# Patient Record
Sex: Female | Born: 1950 | Race: White | Hispanic: No | Marital: Married | Smoking: Former smoker
Health system: Southern US, Community
[De-identification: ages and names within clinical notes are randomized; demographics above are authoritative.]

## PROBLEM LIST (undated history)

## (undated) DIAGNOSIS — E559 Vitamin D deficiency, unspecified: Secondary | ICD-10-CM

## (undated) DIAGNOSIS — K219 Gastro-esophageal reflux disease without esophagitis: Secondary | ICD-10-CM

## (undated) DIAGNOSIS — D735 Infarction of spleen: Secondary | ICD-10-CM

## (undated) DIAGNOSIS — I739 Peripheral vascular disease, unspecified: Secondary | ICD-10-CM

## (undated) DIAGNOSIS — I639 Cerebral infarction, unspecified: Secondary | ICD-10-CM

## (undated) DIAGNOSIS — I82409 Acute embolism and thrombosis of unspecified deep veins of unspecified lower extremity: Secondary | ICD-10-CM

## (undated) DIAGNOSIS — I10 Essential (primary) hypertension: Secondary | ICD-10-CM

## (undated) DIAGNOSIS — E119 Type 2 diabetes mellitus without complications: Secondary | ICD-10-CM

## (undated) DIAGNOSIS — I499 Cardiac arrhythmia, unspecified: Secondary | ICD-10-CM

## (undated) DIAGNOSIS — E785 Hyperlipidemia, unspecified: Secondary | ICD-10-CM

## (undated) DIAGNOSIS — D689 Coagulation defect, unspecified: Secondary | ICD-10-CM

## (undated) DIAGNOSIS — J449 Chronic obstructive pulmonary disease, unspecified: Secondary | ICD-10-CM

## (undated) HISTORY — DX: Gastro-esophageal reflux disease without esophagitis: K21.9

## (undated) HISTORY — DX: Vitamin D deficiency, unspecified: E55.9

## (undated) HISTORY — PX: EYE SURGERY: SHX253

## (undated) HISTORY — DX: Essential (primary) hypertension: I10

## (undated) HISTORY — DX: Peripheral vascular disease, unspecified: I73.9

## (undated) HISTORY — DX: Acute embolism and thrombosis of unspecified deep veins of unspecified lower extremity: I82.409

## (undated) HISTORY — PX: ELBOW SURGERY: SHX618

## (undated) HISTORY — DX: Hyperlipidemia, unspecified: E78.5

## (undated) HISTORY — DX: Type 2 diabetes mellitus without complications: E11.9

## (undated) HISTORY — DX: Cerebral infarction, unspecified: I63.9

## (undated) HISTORY — PX: WISDOM TOOTH EXTRACTION: SHX21

## (undated) HISTORY — DX: Chronic obstructive pulmonary disease, unspecified: J44.9

---

## 2009-08-03 ENCOUNTER — Ambulatory Visit (HOSPITAL_COMMUNITY): Admission: RE | Admit: 2009-08-03 | Discharge: 2009-08-03 | Payer: Self-pay | Admitting: Family Medicine

## 2009-08-03 ENCOUNTER — Ambulatory Visit: Payer: Self-pay | Admitting: Vascular Surgery

## 2011-01-15 ENCOUNTER — Ambulatory Visit (HOSPITAL_COMMUNITY): Payer: Self-pay

## 2011-01-16 ENCOUNTER — Ambulatory Visit (HOSPITAL_COMMUNITY)
Admission: RE | Admit: 2011-01-16 | Discharge: 2011-01-16 | Disposition: A | Discharge status: Home / Self Care | Payer: Managed Care, Other (non HMO) | Source: Ambulatory Visit | Attending: Family Medicine | Admitting: Family Medicine

## 2011-01-16 DIAGNOSIS — M7989 Other specified soft tissue disorders: Secondary | ICD-10-CM | POA: Insufficient documentation

## 2011-01-16 DIAGNOSIS — M79609 Pain in unspecified limb: Secondary | ICD-10-CM | POA: Insufficient documentation

## 2011-01-16 DIAGNOSIS — Z86718 Personal history of other venous thrombosis and embolism: Secondary | ICD-10-CM | POA: Insufficient documentation

## 2012-07-23 ENCOUNTER — Ambulatory Visit: Payer: Managed Care, Other (non HMO) | Attending: Orthopedic Surgery | Admitting: Physical Therapy

## 2012-07-23 DIAGNOSIS — M25669 Stiffness of unspecified knee, not elsewhere classified: Secondary | ICD-10-CM | POA: Insufficient documentation

## 2012-07-23 DIAGNOSIS — M25569 Pain in unspecified knee: Secondary | ICD-10-CM | POA: Insufficient documentation

## 2012-07-23 DIAGNOSIS — IMO0001 Reserved for inherently not codable concepts without codable children: Secondary | ICD-10-CM | POA: Insufficient documentation

## 2012-07-23 DIAGNOSIS — R5381 Other malaise: Secondary | ICD-10-CM | POA: Insufficient documentation

## 2012-07-28 ENCOUNTER — Ambulatory Visit: Payer: Managed Care, Other (non HMO) | Admitting: Physical Therapy

## 2012-07-30 ENCOUNTER — Ambulatory Visit: Payer: Managed Care, Other (non HMO) | Admitting: Physical Therapy

## 2012-08-04 ENCOUNTER — Encounter: Payer: Managed Care, Other (non HMO) | Admitting: Physical Therapy

## 2012-08-07 ENCOUNTER — Ambulatory Visit: Payer: Managed Care, Other (non HMO) | Admitting: Physical Therapy

## 2012-08-11 ENCOUNTER — Ambulatory Visit: Payer: Managed Care, Other (non HMO) | Admitting: Physical Therapy

## 2012-08-13 ENCOUNTER — Ambulatory Visit: Payer: Managed Care, Other (non HMO) | Admitting: Physical Therapy

## 2012-08-18 ENCOUNTER — Ambulatory Visit: Payer: Managed Care, Other (non HMO) | Attending: Orthopedic Surgery | Admitting: Physical Therapy

## 2012-08-18 DIAGNOSIS — IMO0001 Reserved for inherently not codable concepts without codable children: Secondary | ICD-10-CM | POA: Insufficient documentation

## 2012-08-18 DIAGNOSIS — M25569 Pain in unspecified knee: Secondary | ICD-10-CM | POA: Insufficient documentation

## 2012-08-18 DIAGNOSIS — R5381 Other malaise: Secondary | ICD-10-CM | POA: Insufficient documentation

## 2012-08-18 DIAGNOSIS — M25669 Stiffness of unspecified knee, not elsewhere classified: Secondary | ICD-10-CM | POA: Insufficient documentation

## 2012-08-20 ENCOUNTER — Ambulatory Visit: Payer: Managed Care, Other (non HMO) | Admitting: Physical Therapy

## 2015-08-17 ENCOUNTER — Other Ambulatory Visit: Payer: Self-pay | Admitting: Family Medicine

## 2015-08-17 DIAGNOSIS — R1032 Left lower quadrant pain: Secondary | ICD-10-CM

## 2015-08-24 ENCOUNTER — Ambulatory Visit
Admission: RE | Admit: 2015-08-24 | Discharge: 2015-08-24 | Disposition: A | Discharge status: Home / Self Care | Payer: Medicare Other | Source: Ambulatory Visit | Attending: Family Medicine | Admitting: Family Medicine

## 2015-08-24 DIAGNOSIS — R1032 Left lower quadrant pain: Secondary | ICD-10-CM

## 2015-09-04 ENCOUNTER — Encounter: Payer: Self-pay | Admitting: Vascular Surgery

## 2015-09-04 ENCOUNTER — Other Ambulatory Visit: Payer: Self-pay | Admitting: *Deleted

## 2015-09-04 DIAGNOSIS — I739 Peripheral vascular disease, unspecified: Secondary | ICD-10-CM

## 2015-10-26 ENCOUNTER — Other Ambulatory Visit: Payer: Self-pay | Admitting: *Deleted

## 2015-10-26 DIAGNOSIS — I739 Peripheral vascular disease, unspecified: Secondary | ICD-10-CM

## 2015-10-30 ENCOUNTER — Encounter: Payer: Self-pay | Admitting: Vascular Surgery

## 2015-11-03 ENCOUNTER — Encounter: Payer: Self-pay | Admitting: Vascular Surgery

## 2015-11-03 ENCOUNTER — Ambulatory Visit (INDEPENDENT_AMBULATORY_CARE_PROVIDER_SITE_OTHER)
Admission: RE | Admit: 2015-11-03 | Discharge: 2015-11-03 | Disposition: A | Discharge status: Home / Self Care | Payer: Managed Care, Other (non HMO) | Source: Ambulatory Visit | Attending: Vascular Surgery | Admitting: Vascular Surgery

## 2015-11-03 ENCOUNTER — Ambulatory Visit (INDEPENDENT_AMBULATORY_CARE_PROVIDER_SITE_OTHER): Payer: Managed Care, Other (non HMO) | Admitting: Vascular Surgery

## 2015-11-03 ENCOUNTER — Ambulatory Visit (HOSPITAL_COMMUNITY)
Admission: RE | Admit: 2015-11-03 | Discharge: 2015-11-03 | Disposition: A | Discharge status: Home / Self Care | Payer: Managed Care, Other (non HMO) | Source: Ambulatory Visit | Attending: Vascular Surgery | Admitting: Vascular Surgery

## 2015-11-03 VITALS — BP 127/88 | HR 81 | Ht 63.0 in | Wt 161.8 lb

## 2015-11-03 DIAGNOSIS — I82532 Chronic embolism and thrombosis of left popliteal vein: Secondary | ICD-10-CM | POA: Diagnosis not present

## 2015-11-03 DIAGNOSIS — I87009 Postthrombotic syndrome without complications of unspecified extremity: Secondary | ICD-10-CM | POA: Insufficient documentation

## 2015-11-03 DIAGNOSIS — I739 Peripheral vascular disease, unspecified: Secondary | ICD-10-CM | POA: Diagnosis present

## 2015-11-03 DIAGNOSIS — E785 Hyperlipidemia, unspecified: Secondary | ICD-10-CM | POA: Insufficient documentation

## 2015-11-03 DIAGNOSIS — I872 Venous insufficiency (chronic) (peripheral): Secondary | ICD-10-CM | POA: Insufficient documentation

## 2015-11-03 DIAGNOSIS — I10 Essential (primary) hypertension: Secondary | ICD-10-CM | POA: Diagnosis not present

## 2015-11-03 DIAGNOSIS — Z87891 Personal history of nicotine dependence: Secondary | ICD-10-CM | POA: Diagnosis not present

## 2015-11-03 NOTE — Progress Notes (Signed)
Referred by: Wonda CeriseKirsten Kaplan, [facility]  Reason for referral: Swollen left leg  History of Present Illness  Sydney GarinDiana Herring is a 65 y.o. female who presents with chief complaint: swollen leg.  Patient notes, onset of swelling 10 years ago, associated with left DVT in the popliteal vein.  She was on anticoagulation for 5 years.  The patient has had no history of varicose veins, no history of venous stasis ulcers, no history of  Lymphedema and no history of skin changes in lower legs.  There is no family history of venous disorders.  The patient has  used compression stockings in the past OTC.  Other past medical history includes: DM managed with Metformin, HTN managed with Norvasc, COPD managed with albuterol and smoking cessation, and hyperlipidemia managed with Lipitor.    Past Medical History  Diagnosis Date  . Diabetes mellitus without complication (HCC)   . Peripheral vascular disease (HCC)   . Hyperlipidemia   . Hypertension   . Gastroesophageal reflux   . DVT (deep venous thrombosis) (HCC)   . Vitamin D deficiency   . COPD (chronic obstructive pulmonary disease) [facility](HCC)     Past Surgical History  Procedure Laterality Date  . Elbow surgery    . Wisdom tooth extraction      Social History   Social History  . Marital Status: Married    Spouse Name: N/A  . Number of Children: N/A  . Years of Education: N/A   Occupational History  . Not on file.   Social History Main Topics  . Smoking status: Former Smoker    Quit date: 08/19/2015  . Smokeless tobacco: Not on file  . Alcohol Use: 0.0 oz/week    0 Standard drinks or equivalent per week     Comment: social  . Drug Use: No  . Sexual Activity: Not on file   Other Topics Concern  . Not on file   Social History Narrative   Family History: patient is unable to detail the medical history of his parents   Current Outpatient Prescriptions on File Prior to Visit  Medication Sig Dispense Refill  . albuterol (PROVENTIL HFA)  108 (90 Base) MCG/ACT inhaler Inhale 2 puffs into the lungs every 4 (four) hours as needed for wheezing or shortness of breath.    [name]. amLODipine (NORVASC) 2.5 MG tablet Take 2.5 mg by mouth daily.    [name]. atorvastatin (LIPITOR) 10 MG tablet Take 10 mg by mouth daily.    . Fluticasone-Salmeterol (ADVAIR) 250-50 MCG/DOSE AEPB Inhale 1 puff into the lungs 2 (two) times daily.    . metFORMIN (GLUCOPHAGE-XR) 750 MG 24 hr tablet Take 750 mg by mouth daily with breakfast.    . varenicline (CHANTIX) 1 MG tablet Take 1 mg by mouth 2 (two) times daily.     No current facility-administered medications on file prior to visit.    Allergies as of 11/03/2015  . (No Known Allergies)   ROS:   General:  No weight loss, Fever, chills  HEENT: No recent headaches, no nasal bleeding, no visual changes, no sore throat  Neurologic: No dizziness, blackouts, seizures. No recent symptoms of stroke or mini- stroke. No recent episodes of slurred speech, or temporary blindness.  Cardiac: No recent episodes of chest pain/pressure, no shortness of breath at rest.  No shortness of breath with exertion.  Denies history of atrial fibrillation or irregular heartbeat  Vascular: No history of rest pain in feet.  No history of claudication.  No history  of non-healing ulcer, Positve history of DVT   Pulmonary: No home oxygen, no productive cough, no hemoptysis,  No asthma or wheezing  Musculoskeletal:   Arthritis, [x ] Low back pain,   Joint pain  Hematologic:No history of hypercoagulable state.  No history of easy bleeding.  No history of anemia  Gastrointestinal: No hematochezia or melena,  No gastroesophageal reflux, no trouble swallowing  Urinary:  chronic Kidney disease,  on HD -  MWF or  TTHS,  Burning with urination,  Frequent urination,  Difficulty urinating;   Skin: No rashes  Psychological: No history of anxiety,  No history of depression   Physical Examination  Filed Vitals:    11/03/15 1422  BP: 127/88  Pulse: 81  Height:  (1.6 m)  Weight: 161 lb 12.8 oz (73.392 kg)  SpO2: 100%    Body mass index is 28.67 kg/(m^2).  General:  Alert and oriented, no acute distress HEENT: Normal Neck: No bruit or JVD Pulmonary: Clear to auscultation bilaterally Cardiac: Regular Rate and Rhythm without murmur Abdomen: Soft, non-tender, non-distended, no mass, no scars Skin: No rash Extremity Pulses:  2+ radial, brachial, femoral, dorsalis pedis, posterior tibial pulses bilaterally Musculoskeletal: Positive left LE from the knee down edema  Neurologic: Upper and lower extremity motor 5/5 and symmetric Psychiatric: Judgment intact, Mood & affect appropriate for pt's clinical situation Lymph : No Cervical, Axillary, or Inguinal lymphadenopathy   DATA: Venous reflux study:  Residual left popliteal thrombus  Positive reflux in the common femoral, femoral and popliteal vein  No GSV and SSV reflux  ABI's normal  Right 1.1  Left 1.0   Assessment: Post thrombotic syndrome S/P Left popliteal DVT managed for 5 years with oral coumadin  Plan: She is encouraged to wear compression thigh high stocking and or compression panty hose. Water aerobic can help also due to hydrostatic compression as well as elevation when at rest.   The is no definitive cure, the vein has been damaged by the DVT and causes reflux.  The treatment plan above will help in the long run to prevent skin breakdown from chronic edema.    F/U PRN  Thomasena Edis, Siani Utke MAUREEN PA-C [facility]  The patient was seen in conjunction with Dr. Imogene Burn  Addendum  I have independently interviewed and examined the patient, and I agree with the physician assistant's findings.  Pt essential presents with mild post-phlebitic syndrome.  There is no LDS despite persistent LLE edema 1+.  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  Leonides Sake, MD [facility] [place] Office: [phone] Pager: [phone]  11/03/2015, 3:16 PM

## 2016-04-19 DIAGNOSIS — D735 Infarction of spleen: Secondary | ICD-10-CM

## 2016-04-19 HISTORY — DX: Infarction of spleen: D73.5

## 2016-05-06 ENCOUNTER — Encounter (HOSPITAL_BASED_OUTPATIENT_CLINIC_OR_DEPARTMENT_OTHER): Payer: Self-pay | Admitting: *Deleted

## 2016-05-06 ENCOUNTER — Emergency Department (HOSPITAL_BASED_OUTPATIENT_CLINIC_OR_DEPARTMENT_OTHER): Payer: Managed Care, Other (non HMO)

## 2016-05-06 ENCOUNTER — Inpatient Hospital Stay (HOSPITAL_BASED_OUTPATIENT_CLINIC_OR_DEPARTMENT_OTHER)
Admission: EM | Admit: 2016-05-06 | Discharge: 2016-05-09 | DRG: 816 | Disposition: A | Discharge status: Home / Self Care | Payer: Managed Care, Other (non HMO) | Attending: Family Medicine | Admitting: Family Medicine

## 2016-05-06 DIAGNOSIS — J449 Chronic obstructive pulmonary disease, unspecified: Secondary | ICD-10-CM | POA: Diagnosis present

## 2016-05-06 DIAGNOSIS — F17201 Nicotine dependence, unspecified, in remission: Secondary | ICD-10-CM

## 2016-05-06 DIAGNOSIS — Z86718 Personal history of other venous thrombosis and embolism: Secondary | ICD-10-CM

## 2016-05-06 DIAGNOSIS — E118 Type 2 diabetes mellitus with unspecified complications: Secondary | ICD-10-CM

## 2016-05-06 DIAGNOSIS — Z6829 Body mass index (BMI) 29.0-29.9, adult: Secondary | ICD-10-CM

## 2016-05-06 DIAGNOSIS — I749 Embolism and thrombosis of unspecified artery: Secondary | ICD-10-CM

## 2016-05-06 DIAGNOSIS — Z7984 Long term (current) use of oral hypoglycemic drugs: Secondary | ICD-10-CM

## 2016-05-06 DIAGNOSIS — D735 Infarction of spleen: Principal | ICD-10-CM | POA: Diagnosis present

## 2016-05-06 DIAGNOSIS — I10 Essential (primary) hypertension: Secondary | ICD-10-CM | POA: Diagnosis present

## 2016-05-06 DIAGNOSIS — T508X5A Adverse effect of diagnostic agents, initial encounter: Secondary | ICD-10-CM

## 2016-05-06 DIAGNOSIS — I700 Atherosclerosis of aorta: Secondary | ICD-10-CM | POA: Diagnosis present

## 2016-05-06 DIAGNOSIS — E785 Hyperlipidemia, unspecified: Secondary | ICD-10-CM | POA: Diagnosis present

## 2016-05-06 DIAGNOSIS — Z7951 Long term (current) use of inhaled steroids: Secondary | ICD-10-CM

## 2016-05-06 DIAGNOSIS — I82532 Chronic embolism and thrombosis of left popliteal vein: Secondary | ICD-10-CM | POA: Diagnosis present

## 2016-05-06 DIAGNOSIS — E1169 Type 2 diabetes mellitus with other specified complication: Secondary | ICD-10-CM | POA: Diagnosis present

## 2016-05-06 DIAGNOSIS — Z87891 Personal history of nicotine dependence: Secondary | ICD-10-CM

## 2016-05-06 DIAGNOSIS — Z833 Family history of diabetes mellitus: Secondary | ICD-10-CM

## 2016-05-06 DIAGNOSIS — E669 Obesity, unspecified: Secondary | ICD-10-CM | POA: Diagnosis present

## 2016-05-06 DIAGNOSIS — E1151 Type 2 diabetes mellitus with diabetic peripheral angiopathy without gangrene: Secondary | ICD-10-CM | POA: Diagnosis present

## 2016-05-06 DIAGNOSIS — K219 Gastro-esophageal reflux disease without esophagitis: Secondary | ICD-10-CM | POA: Diagnosis present

## 2016-05-06 DIAGNOSIS — E559 Vitamin D deficiency, unspecified: Secondary | ICD-10-CM | POA: Diagnosis present

## 2016-05-06 DIAGNOSIS — R1012 Left upper quadrant pain: Secondary | ICD-10-CM | POA: Diagnosis not present

## 2016-05-06 HISTORY — DX: Infarction of spleen: D73.5

## 2016-05-06 LAB — LIPASE, BLOOD: LIPASE: 21 U/L (ref 11–51)

## 2016-05-06 LAB — URINALYSIS, MICROSCOPIC (REFLEX)

## 2016-05-06 LAB — COMPREHENSIVE METABOLIC PANEL
ALK PHOS: 81 U/L (ref 38–126)
ALT: 32 U/L (ref 14–54)
AST: 24 U/L (ref 15–41)
Albumin: 4.7 g/dL (ref 3.5–5.0)
Anion gap: 12 (ref 5–15)
BUN: 21 mg/dL — AB (ref 6–20)
CALCIUM: 9.1 mg/dL (ref 8.9–10.3)
CHLORIDE: 102 mmol/L (ref 101–111)
CO2: 24 mmol/L (ref 22–32)
CREATININE: 0.78 mg/dL (ref 0.44–1.00)
Glucose, Bld: 182 mg/dL — ABNORMAL HIGH (ref 65–99)
Potassium: 3.9 mmol/L (ref 3.5–5.1)
Sodium: 138 mmol/L (ref 135–145)
Total Bilirubin: 1 mg/dL (ref 0.3–1.2)
Total Protein: 7.9 g/dL (ref 6.5–8.1)

## 2016-05-06 LAB — URINALYSIS, ROUTINE W REFLEX MICROSCOPIC
BILIRUBIN URINE: NEGATIVE
Glucose, UA: 250 mg/dL — AB
KETONES UR: NEGATIVE mg/dL
Leukocytes, UA: NEGATIVE
NITRITE: NEGATIVE
Protein, ur: 30 mg/dL — AB
SPECIFIC GRAVITY, URINE: 1.036 — AB (ref 1.005–1.030)
pH: 6 (ref 5.0–8.0)

## 2016-05-06 LAB — CBC WITH DIFFERENTIAL/PLATELET
BASOS ABS: 0 10*3/uL (ref 0.0–0.1)
Basophils Relative: 0 %
EOS PCT: 0 %
Eosinophils Absolute: 0 10*3/uL (ref 0.0–0.7)
HEMATOCRIT: 39.3 % (ref 36.0–46.0)
HEMOGLOBIN: 13.1 g/dL (ref 12.0–15.0)
LYMPHS ABS: 1.3 10*3/uL (ref 0.7–4.0)
LYMPHS PCT: 15 %
MCH: 29.9 pg (ref 26.0–34.0)
MCHC: 33.3 g/dL (ref 30.0–36.0)
MCV: 89.7 fL (ref 78.0–100.0)
Monocytes Absolute: 0.3 10*3/uL (ref 0.1–1.0)
Monocytes Relative: 4 %
NEUTROS ABS: 7 10*3/uL (ref 1.7–7.7)
NEUTROS PCT: 81 %
PLATELETS: 304 10*3/uL (ref 150–400)
RBC: 4.38 MIL/uL (ref 3.87–5.11)
RDW: 13.9 % (ref 11.5–15.5)
WBC: 8.7 10*3/uL (ref 4.0–10.5)

## 2016-05-06 MED ORDER — ONDANSETRON HCL 4 MG/2ML IJ SOLN
4.0000 mg | Freq: Once | INTRAMUSCULAR | Status: AC
  Administered 2016-05-06: 4 mg via INTRAVENOUS
  Filled 2016-05-06: qty 2

## 2016-05-06 MED ORDER — KETOROLAC TROMETHAMINE 30 MG/ML IJ SOLN
30.0000 mg | Freq: Once | INTRAMUSCULAR | Status: AC
  Administered 2016-05-06: 30 mg via INTRAVENOUS
  Filled 2016-05-06: qty 1

## 2016-05-06 MED ORDER — MORPHINE SULFATE (PF) 4 MG/ML IV SOLN
4.0000 mg | Freq: Once | INTRAVENOUS | Status: AC
  Administered 2016-05-06: 4 mg via INTRAVENOUS
  Filled 2016-05-06: qty 1

## 2016-05-06 MED ORDER — IOPAMIDOL (ISOVUE-300) INJECTION 61%
100.0000 mL | Freq: Once | INTRAVENOUS | Status: AC | PRN
  Administered 2016-05-07: 100 mL via INTRAVENOUS

## 2016-05-06 MED ORDER — SODIUM CHLORIDE 0.9 % IV BOLUS (SEPSIS)
1000.0000 mL | Freq: Once | INTRAVENOUS | Status: AC
  Administered 2016-05-06: 1000 mL via INTRAVENOUS

## 2016-05-06 NOTE — ED Notes (Signed)
C/o n/v and abd pain x 1 day  Denies diarrhea,  Denies ua sx

## 2016-05-06 NOTE — ED Triage Notes (Signed)
Abdominal pain. Vomited.

## 2016-05-06 NOTE — ED Provider Notes (Addendum)
[facility] Note   CSN: [id number] Arrival date & time: 05/06/16  1943  By signing my name below, I, Sydney Herring, attest that this documentation has been prepared under the direction and in the presence of Tomasita CrumbleAdeleke Briah Nary, MD. Electronically signed, Sydney Herring, ED Scribe. 05/06/16. 11:22 PM.  History   Chief Complaint Chief Complaint  Patient presents with  . Abdominal Pain    HPI HPI Comments: [name] is a 65 y.o. female, with Hx of COPD, DVT, DM who presents to the Emergency Department complaining of gradual onset, constant abdominal pain that started earlier today. Pt reports pain radiating from the center of her abdomen around to her back. She reports one episode of nausea and vomiting in the waiting room of the ED. Pt reports Hx of intermittent similar symptoms for the past 8 months, today's episode was so severe that she was unable to lay down. She takes an OTC antacid. She reports a feeling of pressure "under her chest" that radiates behind to her back. No diarrhea. No shortness of breath.  The history is provided by the patient. No language interpreter was used.    Past Medical History:  Diagnosis Date  . COPD (chronic obstructive pulmonary disease) (HCC)   . Diabetes mellitus without complication (HCC)   . DVT (deep venous thrombosis) (HCC)   . Gastroesophageal reflux   . Hyperlipidemia   . Hypertension   . Peripheral vascular disease (HCC)   . Vitamin D deficiency     Patient Active Problem List   Diagnosis Date Noted  . 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 Chronic venous insufficiency 11/03/2015    Past Surgical History:  Procedure Laterality Date  . ELBOW SURGERY    . EYE SURGERY    . WISDOM TOOTH EXTRACTION      OB History    No data available      Home Medications    Prior to Admission medications    Medication Sig Start Date End Date Taking? Authorizing Provider  albuterol (PROVENTIL HFA) 108 (90 Base) MCG/ACT inhaler Inhale 2 puffs into the lungs every 4 (four) hours as needed for wheezing or shortness of breath.   Yes Historical Provider, MD  amLODipine (NORVASC) 2.5 MG tablet Take 2.5 mg by mouth daily.   Yes Historical Provider, MD  atorvastatin (LIPITOR) 10 MG tablet Take 10 mg by mouth daily.   Yes Historical Provider, MD  Fluticasone-Salmeterol (ADVAIR) 250-50 MCG/DOSE AEPB Inhale 1 puff into the lungs 2 (two) times daily.   Yes Historical Provider, MD  metFORMIN (GLUCOPHAGE-XR) 750 MG 24 hr tablet Take 750 mg by mouth daily with breakfast.   Yes Historical Provider, MD  varenicline (CHANTIX) 1 MG tablet Take 1 mg by mouth 2 (two) times daily.    Historical Provider, MD    Family History No family history on file.  Social History Social History  Substance Use Topics  . Smoking status: Former Smoker    Quit date: 08/19/2015  . Smokeless tobacco: Never Used  . Alcohol use 0.0 oz/week     Comment: social    Allergies   Patient has no known allergies.   Review of Systems Review of Systems A complete 10 system review of systems was obtained and all systems are negative except as noted in the HPI and PMH.    Physical Exam Updated Vital Signs BP 136/89  Pulse 81   Temp 98.1 F (36.7 C)   Resp 16   Ht 5\' 3"  (1.6 m)   Wt 158 lb (71.7 kg)   SpO2 97%   BMI 27.99 kg/m   Physical Exam  Constitutional: She is oriented to person, place, and time. She appears well-developed and well-nourished. No distress.  HENT:  Head: Normocephalic and atraumatic.  Nose: Nose normal.  Mouth/Throat: Oropharynx is clear and moist. No oropharyngeal exudate.  Eyes: Conjunctivae and EOM are normal. Pupils are equal, round, and reactive to light. No scleral icterus.  Neck: Normal range of motion. Neck supple. No JVD present. No tracheal deviation present. No thyromegaly present.   Cardiovascular: Normal rate, regular rhythm and normal heart sounds.  Exam reveals no gallop and no friction rub.   No murmur heard. Pulmonary/Chest: Effort normal and breath sounds normal. No respiratory distress. She has no wheezes. She exhibits no tenderness.  Abdominal: Soft. Bowel sounds are normal. She exhibits no distension and no mass. There is tenderness. There is no rebound and no guarding.  Diffuse abdominal tenderness to palpation, worse in RUQ.  Musculoskeletal: Normal range of motion. She exhibits no edema or tenderness.  Lymphadenopathy:    She has no cervical adenopathy.  Neurological: She is alert and oriented to person, place, and time. No cranial nerve deficit. She exhibits normal muscle tone.  Skin: Skin is warm and dry. No rash noted. No erythema. No pallor.  Nursing note and vitals reviewed.   ED Treatments / Results  DIAGNOSTIC STUDIES: Oxygen Saturation is 100% on RA, normal by my interpretation.  COORDINATION OF CARE: 11:10 PM-Discussed treatment plan with pt at bedside and pt agreed to plan.   Labs (all labs ordered are listed, but only abnormal results are displayed) Labs Reviewed  URINALYSIS, ROUTINE W REFLEX MICROSCOPIC - Abnormal; Notable for the following:       Result Value   Specific Gravity, Urine 1.036 (*)    Glucose, UA 250 (*)    Hgb urine dipstick TRACE (*)    Protein, ur 30 (*)    All other components within normal limits  URINALYSIS, MICROSCOPIC (REFLEX) - Abnormal; Notable for the following:    Bacteria, UA RARE (*)    Squamous Epithelial / LPF 0-5 (*)    All other components within normal limits  COMPREHENSIVE METABOLIC PANEL - Abnormal; Notable for the following:    Glucose, Bld 182 (*)    BUN 21 (*)    All other components within normal limits  CULTURE, BLOOD (ROUTINE X 2)  CULTURE, BLOOD (ROUTINE X 2)  CBC WITH DIFFERENTIAL/PLATELET  LIPASE, BLOOD    EKG  EKG Interpretation  Date/Time:  Monday May 06 2016 23:24:24  EST Ventricular Rate:  86 PR Interval:    QRS Duration: 105 QT Interval:  418 QTC Calculation: 500 R Axis:   51 Text Interpretation:  Sinus rhythm Multiple ventricular premature complexes RSR' in V1 or V2, right VCD or RVH Borderline T abnormalities, anterior leads Borderline prolonged QT interval No old tracing to compare Confirmed by Erroll Luna [phone]) on 05/07/2016 12:01:35 AM       Radiology Ct Abdomen Pelvis W Contrast  Result Date: 05/07/2016 CLINICAL DATA:  65 year old female with diffuse abdominal pain worse in the left upper quadrant. EXAM: CT ABDOMEN AND PELVIS WITH CONTRAST TECHNIQUE: Multidetector CT imaging of the abdomen and pelvis was performed using the standard protocol following bolus administration of intravenous contrast. CONTRAST:  ISOVUE-300 IOPAMIDOL (ISOVUE-300) INJECTION 61% COMPARISON:  Abdominal ultrasound dated 08/24/2015 FINDINGS: Lower chest: The visualized lung bases are clear. Coronary vascular calcification involving the LAD. No intra-abdominal free air or free fluid. Hepatobiliary: Mild diffuse fatty infiltration of the liver. Of the liver is otherwise unremarkable. No intrahepatic biliary ductal dilatation. There is irregular and thickened appearance of the gallbladder fundus likely adenomyomatosis. No gallstones. Pancreas: Unremarkable. No pancreatic ductal dilatation or surrounding inflammatory changes. Spleen: There is a 3.0 x 3.3 cm area of non enhancement involving the inferior pole of the spleen compatible with a focal splenic infarct. No perisplenic fluid collection, abscess, or hematoma. Adrenals/Urinary Tract: The adrenal glands appear unremarkable. The kidneys, visualized ureters, and urinary bladder are grossly unremarkable. Stomach/Bowel: There is a small hiatal hernia with gastroesophageal reflux. Oral contrast opacifies the stomach and multiple loops of small bowel traverses into the colon. No evidence of bowel obstruction or active  inflammation. There is sigmoid diverticulosis with muscular hypertrophy. No definite active inflammatory changes identified. Normal appendix. Vascular/Lymphatic: There is moderate aortoiliac atherosclerotic disease. The abdominal aorta and IVC are otherwise unremarkable. No portal venous gas identified. The splenic vein, SMV, and main portal vein are patent. There is no adenopathy. Reproductive: The uterus and ovaries are grossly unremarkable. Other: None Musculoskeletal: No acute or significant osseous findings. 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 No fluid collection/ abscess or hematoma. Fatty liver. Sigmoid diverticulosis. No evidence of bowel obstruction or active inflammation. Normal appendix. Electronically Signed   By: Elgie Collard M.D.   On: 05/07/2016 02:16    Procedures Procedures (including critical care time)  Medications Ordered in ED Medications  sodium chloride 0.9 % bolus 1,000 mL (0 mLs Intravenous Stopped 05/06/16 2334)  ondansetron (ZOFRAN) injection 4 mg (4 mg Intravenous Given 05/06/16 2247)  morphine 4 MG/ML injection 4 mg (4 mg Intravenous Given 05/06/16 2332)  ketorolac (TORADOL) 30 MG/ML injection 30 mg (30 mg Intravenous Given 05/06/16 2333)  iopamidol (ISOVUE-300) 61 % injection 100 mL (100 mLs Intravenous Contrast Given 05/07/16 0144)  morphine 4 MG/ML injection 4 mg (4 mg Intravenous Given 05/07/16 0240)     Initial Impression / Assessment and Plan / ED Course  I have reviewed the triage vital signs and the nursing notes.  Pertinent labs & imaging results that were available during my care of the patient were reviewed by me and considered in my medical decision making (see chart for details).  Clinical Course    Patient presents to the ED for LUQ abd pain which radiates to her back for the past several months.  Tonight was more severe than normal with vomiting.  Will obtain EKG to ensure this is not cardiac.  She was  given mornphine and toradol for pain.  Plan for CT for further evaluation.   3:02 AM CT reveals splenic infarct.  Plan to admit to hospitalist at [facility] for further care.  Dr. Maryfrances Bunnell requests blood cultures as well.  This was ordered. Patient was explained her diagnosis.   CRITICAL CARE Performed by: Tomasita Crumble   Total critical care time: 45 minutes - splenic infarct requiring transfer  Critical care time was exclusive of separately billable procedures and treating other patients.  Critical care was necessary to treat or prevent imminent or life-threatening deterioration.  Critical care was time spent personally by me on the following activities: development of treatment plan with patient and/or surrogate as well as nursing, discussions with consultants, evaluation of patient's response to treatment, examination of patient, obtaining history from patient or  surrogate, ordering and performing treatments and interventions, ordering and review of laboratory studies, ordering and review of radiographic studies, pulse oximetry and re-evaluation of patient's condition.  Final Clinical Impressions(s) / ED Diagnoses   Final diagnoses:  Splenic infarct    New Prescriptions New Prescriptions   No medications on file     I personally performed the services described in this documentation, which was scribed in my presence. The recorded information has been reviewed and is accurate.           Tomasita CrumbleAdeleke Tonnie Friedel, MD 05/07/16 [phone]

## 2016-05-07 ENCOUNTER — Encounter (HOSPITAL_COMMUNITY): Payer: Self-pay | Admitting: General Practice

## 2016-05-07 DIAGNOSIS — I748 Embolism and thrombosis of other arteries: Secondary | ICD-10-CM

## 2016-05-07 DIAGNOSIS — I10 Essential (primary) hypertension: Secondary | ICD-10-CM | POA: Diagnosis present

## 2016-05-07 DIAGNOSIS — R1012 Left upper quadrant pain: Secondary | ICD-10-CM | POA: Diagnosis present

## 2016-05-07 DIAGNOSIS — J410 Simple chronic bronchitis: Secondary | ICD-10-CM | POA: Diagnosis not present

## 2016-05-07 DIAGNOSIS — E669 Obesity, unspecified: Secondary | ICD-10-CM | POA: Diagnosis present

## 2016-05-07 DIAGNOSIS — E1169 Type 2 diabetes mellitus with other specified complication: Secondary | ICD-10-CM | POA: Diagnosis present

## 2016-05-07 DIAGNOSIS — J449 Chronic obstructive pulmonary disease, unspecified: Secondary | ICD-10-CM | POA: Diagnosis present

## 2016-05-07 DIAGNOSIS — E784 Other hyperlipidemia: Secondary | ICD-10-CM

## 2016-05-07 DIAGNOSIS — Z86718 Personal history of other venous thrombosis and embolism: Secondary | ICD-10-CM | POA: Diagnosis not present

## 2016-05-07 DIAGNOSIS — E785 Hyperlipidemia, unspecified: Secondary | ICD-10-CM | POA: Diagnosis present

## 2016-05-07 DIAGNOSIS — E118 Type 2 diabetes mellitus with unspecified complications: Secondary | ICD-10-CM

## 2016-05-07 DIAGNOSIS — K219 Gastro-esophageal reflux disease without esophagitis: Secondary | ICD-10-CM | POA: Diagnosis present

## 2016-05-07 DIAGNOSIS — D735 Infarction of spleen: Secondary | ICD-10-CM | POA: Diagnosis present

## 2016-05-07 DIAGNOSIS — Z87891 Personal history of nicotine dependence: Secondary | ICD-10-CM | POA: Diagnosis not present

## 2016-05-07 DIAGNOSIS — E1151 Type 2 diabetes mellitus with diabetic peripheral angiopathy without gangrene: Secondary | ICD-10-CM | POA: Diagnosis present

## 2016-05-07 DIAGNOSIS — F17201 Nicotine dependence, unspecified, in remission: Secondary | ICD-10-CM

## 2016-05-07 DIAGNOSIS — E559 Vitamin D deficiency, unspecified: Secondary | ICD-10-CM | POA: Diagnosis present

## 2016-05-07 DIAGNOSIS — Z7951 Long term (current) use of inhaled steroids: Secondary | ICD-10-CM | POA: Diagnosis not present

## 2016-05-07 DIAGNOSIS — Z6829 Body mass index (BMI) 29.0-29.9, adult: Secondary | ICD-10-CM | POA: Diagnosis not present

## 2016-05-07 DIAGNOSIS — I700 Atherosclerosis of aorta: Secondary | ICD-10-CM | POA: Diagnosis present

## 2016-05-07 DIAGNOSIS — Z7984 Long term (current) use of oral hypoglycemic drugs: Secondary | ICD-10-CM | POA: Diagnosis not present

## 2016-05-07 DIAGNOSIS — Z833 Family history of diabetes mellitus: Secondary | ICD-10-CM | POA: Diagnosis not present

## 2016-05-07 LAB — ANTITHROMBIN III: AntiThromb III Func: 115 % (ref 75–120)

## 2016-05-07 LAB — HEMOGLOBIN A1C
HEMOGLOBIN A1C: 8.7 % — AB (ref 4.8–5.6)
Mean Plasma Glucose: 203 mg/dL

## 2016-05-07 LAB — GLUCOSE, CAPILLARY
GLUCOSE-CAPILLARY: 147 mg/dL — AB (ref 65–99)
Glucose-Capillary: 127 mg/dL — ABNORMAL HIGH (ref 65–99)
Glucose-Capillary: 142 mg/dL — ABNORMAL HIGH (ref 65–99)
Glucose-Capillary: 117 mg/dL — ABNORMAL HIGH (ref 65–99)
Glucose-Capillary: 100 mg/dL — ABNORMAL HIGH (ref 65–99)

## 2016-05-07 LAB — HEPARIN LEVEL (UNFRACTIONATED): Heparin Unfractionated: 0.43 IU/mL (ref 0.30–0.70)

## 2016-05-07 LAB — LACTATE DEHYDROGENASE: LDH: 181 U/L (ref 98–192)

## 2016-05-07 LAB — MAGNESIUM: MAGNESIUM: 1.9 mg/dL (ref 1.7–2.4)

## 2016-05-07 LAB — TROPONIN I

## 2016-05-07 MED ORDER — HEPARIN BOLUS VIA INFUSION
4000.0000 [IU] | Freq: Once | INTRAVENOUS | Status: AC
  Administered 2016-05-07: 4000 [IU] via INTRAVENOUS
  Filled 2016-05-07: qty 4000

## 2016-05-07 MED ORDER — MORPHINE SULFATE (PF) 2 MG/ML IV SOLN
1.0000 mg | INTRAVENOUS | Status: DC | PRN
  Administered 2016-05-07 (×2): 4 mg via INTRAVENOUS
  Filled 2016-05-07 (×2): qty 2

## 2016-05-07 MED ORDER — GI COCKTAIL ~~LOC~~
30.0000 mL | Freq: Once | ORAL | Status: AC
  Administered 2016-05-07: 30 mL via ORAL
  Filled 2016-05-07 (×3): qty 30

## 2016-05-07 MED ORDER — MORPHINE SULFATE (PF) 4 MG/ML IV SOLN
4.0000 mg | Freq: Once | INTRAVENOUS | Status: AC
  Administered 2016-05-07: 4 mg via INTRAVENOUS
  Filled 2016-05-07: qty 1

## 2016-05-07 MED ORDER — ATORVASTATIN CALCIUM 10 MG PO TABS
10.0000 mg | ORAL_TABLET | Freq: Every day | ORAL | Status: DC
  Administered 2016-05-07 – 2016-05-09 (×3): 10 mg via ORAL
  Filled 2016-05-07 (×3): qty 1

## 2016-05-07 MED ORDER — ONDANSETRON HCL 4 MG/2ML IJ SOLN: 4.0000 mg | Freq: Four times a day (QID) | INTRAMUSCULAR | Status: DC | PRN

## 2016-05-07 MED ORDER — HYDROCODONE-ACETAMINOPHEN 5-325 MG PO TABS
1.0000 | ORAL_TABLET | ORAL | Status: DC | PRN
  Administered 2016-05-07 – 2016-05-08 (×4): 1 via ORAL
  Filled 2016-05-07 (×4): qty 1

## 2016-05-07 MED ORDER — INSULIN ASPART 100 UNIT/ML ~~LOC~~ SOLN: 0.0000 [IU] | Freq: Every day | SUBCUTANEOUS | Status: DC

## 2016-05-07 MED ORDER — HEPARIN (PORCINE) IN NACL 100-0.45 UNIT/ML-% IJ SOLN
1050.0000 [IU]/h | INTRAMUSCULAR | Status: DC
  Administered 2016-05-07 – 2016-05-09 (×3): 1100 [IU]/h via INTRAVENOUS
  Filled 2016-05-07 (×5): qty 250

## 2016-05-07 MED ORDER — INSULIN ASPART 100 UNIT/ML ~~LOC~~ SOLN
0.0000 [IU] | Freq: Three times a day (TID) | SUBCUTANEOUS | Status: DC
  Administered 2016-05-07 – 2016-05-08 (×3): 1 [IU] via SUBCUTANEOUS
  Administered 2016-05-09: 2 [IU] via SUBCUTANEOUS
  Administered 2016-05-09: 1 [IU] via SUBCUTANEOUS
  Administered 2016-05-09: 2 [IU] via SUBCUTANEOUS

## 2016-05-07 MED ORDER — AMLODIPINE BESYLATE 2.5 MG PO TABS
2.5000 mg | ORAL_TABLET | Freq: Every day | ORAL | Status: DC
  Administered 2016-05-07 – 2016-05-09 (×3): 2.5 mg via ORAL
  Filled 2016-05-07 (×3): qty 1

## 2016-05-07 MED ORDER — MOMETASONE FURO-FORMOTEROL FUM 200-5 MCG/ACT IN AERO
2.0000 | INHALATION_SPRAY | Freq: Two times a day (BID) | RESPIRATORY_TRACT | Status: DC
  Administered 2016-05-08: 2 via RESPIRATORY_TRACT
  Filled 2016-05-07: qty 8.8

## 2016-05-07 MED ORDER — SODIUM CHLORIDE 0.9 % IV SOLN
INTRAVENOUS | Status: DC
  Administered 2016-05-07 – 2016-05-08 (×2): via INTRAVENOUS

## 2016-05-07 NOTE — Progress Notes (Signed)
Notified [facility] via text page of patient's arrival in 6N06.  Pt requesting pain med.

## 2016-05-07 NOTE — Consult Note (Addendum)
[facility] [place] CONSULT [place]   MRN : [id number]  Reason for Consult: Splenic infarct  Referring Physician: Junious SilkAllison Ellis NP  History of Present Illness: 65 y/o female with progressive central abdominal pain that radiates to the left lumbar area left subcostal.  She states it has progressed over the past 8 months but it was worse last evening.  She has had GI work up to include abdominal ultrasound and colonoscopy.  Which were both negative.  She denies A fib, symptoms of claudication, and no history of TIA or stroke.  Other contributing history includes left LE DVT in the popliteal vein  10 years ago managed with 4 years of anti coagulation therapy with Coumadin.  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  Past medical history includes: COPD, DM, htn manage with Norvasc, and Hyperlipidemia managed with Lipitor.  She is currently not on any anticoagulation medication.     Current Facility-Administered Medications  Medication Dose Route Frequency Provider Last Rate Last Dose  . 0.9 %  sodium chloride infusion   Intravenous Continuous Russella DarAllison L Ellis, NP      . amLODipine (NORVASC) tablet 2.5 mg  2.5 mg Oral Daily Russella DarAllison L Ellis, NP      . atorvastatin (LIPITOR) tablet 10 mg  10 mg Oral q1800 Russella DarAllison L Ellis, NP      . heparin ADULT infusion 100 units/mL (25000 units/210650mL sodium chloride 0.45%)  1,100 Units/hr Intravenous Continuous Juliette MangleVeronda P Bryk, RPH 11 mL/hr at 05/07/16 0829 1,100 Units/hr at 05/07/16 0829  . HYDROcodone-acetaminophen (NORCO/VICODIN) 5-325 MG per tablet 1 tablet  1 tablet Oral Q4H PRN Alberteen Samhristopher P Danford, MD   1 tablet at 05/07/16 [phone]  . insulin aspart (novoLOG) injection 0-5 Units  0-5 Units Subcutaneous QHS Russella DarAllison L Ellis, NP      . insulin aspart (novoLOG) injection 0-9 Units  0-9 Units Subcutaneous TID WC Russella DarAllison L Ellis, NP   1 Units at 05/07/16 [phone]  . mometasone-formoterol (DULERA) 200-5 MCG/ACT inhaler 2  puff  2 puff Inhalation BID Russella DarAllison L Ellis, NP      . morphine 2 MG/ML injection 1-4 mg  1-4 mg Intravenous Q2H PRN Russella DarAllison L Ellis, NP   4 mg at 05/07/16 0845  . ondansetron (ZOFRAN) injection 4 mg  4 mg Intravenous Q6H PRN Russella DarAllison L Ellis, NP        Pt meds include: Statin :Yes Betablocker: No ASA: No Other anticoagulants/antiplatelets: none  Past Medical History:  Diagnosis Date  . COPD (chronic obstructive pulmonary disease) (HCC)   . Diabetes mellitus without complication (HCC)   . DVT (deep venous thrombosis) (HCC)   . Gastroesophageal reflux   . Hyperlipidemia   . Hypertension   . Peripheral vascular disease (HCC)   . Vitamin D deficiency     Past Surgical History:  Procedure Laterality Date  . ELBOW SURGERY    . EYE SURGERY    . WISDOM TOOTH EXTRACTION      Social History Social History  Substance Use Topics  . Smoking status: Former Smoker    Quit date: 08/19/2015  . Smokeless tobacco: Never Used  . Alcohol use 0.0 oz/week     Comment: social    Family History No family history on file.  Mother with DM and vascular disease   No Known Allergies   REVIEW OF SYSTEMS  General: [ ]  Weight loss, [ ]  Fever, [ ]  chills Neurologic: [ ]  Dizziness, [ ]  Blackouts, [ ]   Seizure [ ]  Stroke, [ ]  "Mini stroke", [ ]  Slurred speech, [ ]  Temporary blindness; [ ]  weakness in arms or legs, [ ]  Hoarseness [ ]  Dysphagia Cardiac: [ ]  Chest pain/pressure, [ ]  Shortness of breath at rest [ ]  Shortness of breath with exertion, [ ]  Atrial fibrillation or irregular heartbeat  Vascular: [ ]  Pain in legs with walking, [ ]  Pain in legs at rest, [ ]  Pain in legs at night,  [ ]  Non-healing ulcer, [ ]  Blood clot in vein/DVT,   Pulmonary: [ ]  Home oxygen, [ ]  Productive cough, [ ]  Coughing up blood, [ ]  Asthma,  [ ]  Wheezing [ ]  COPD Musculoskeletal:  [ ]  Arthritis, [ ]  Low back pain, [ ]  Joint pain Hematologic: [ ]  Easy Bruising, [ ]  Anemia; [ ]  Hepatitis Gastrointestinal: [ ]  Blood  in stool, [ ]  Gastroesophageal Reflux/heartburn, Urinary: [ ]  chronic Kidney disease, [ ]  on HD - [ ]  MWF or [ ]  TTHS, [ ]  Burning with urination, [ ]  Difficulty urinating Skin: [ ]  Rashes, [ ]  Wounds Psychological: [ ]  Anxiety, [ ]  Depression  Physical Examination Vitals:   05/07/16 0001 05/07/16 0244 05/07/16 0403 05/07/16 0519  BP: 144/84 136/89 145/78 137/74  Pulse: 82 81  83  Resp: 18 16 16 17   Temp:  98.1 F (36.7 C) 97.9 F (36.6 C) 98.3 F (36.8 C)  TempSrc:    Oral  SpO2: 100% 97% 97% 100%  Weight:    169 lb 3.2 oz (76.7 kg)  Height:    5\' 3"  (1.6 m)   Body mass index is 29.97 kg/m.  General:  WDWN in NAD Gait: Normal HENT: WNL Eyes: Pupils equal Pulmonary: normal non-labored breathing , without Rales, rhonchi,  wheezing Cardiac: RRR, without  Murmurs, rubs or gallops; No carotid bruits Abdomen: soft, slight tenderness leUQ, no masses Skin: no rashes, ulcers noted;  no Gangrene , no cellulitis; no open wounds;   Vascular Exam/Pulses:Palpable Radial, femoral, DP bilaterally   Musculoskeletal: no muscle wasting or atrophy; no edema  Neurologic: A&O X 3; Appropriate Affect ;  SENSATION: normal; MOTOR FUNCTION: 5/5 Symmetric Speech is fluent/normal   Significant Diagnostic Studies: CBC Lab Results  Component Value Date   WBC 8.7 05/06/2016   HGB 13.1 05/06/2016   HCT 39.3 05/06/2016   MCV 89.7 05/06/2016   PLT 304 05/06/2016    BMET    Component Value Date/Time   NA 138 05/06/2016 2239   K 3.9 05/06/2016 2239   CL 102 05/06/2016 2239   CO2 24 05/06/2016 2239   GLUCOSE 182 (H) 05/06/2016 2239   BUN 21 (H) 05/06/2016 2239   CREATININE 0.78 05/06/2016 2239   CALCIUM 9.1 05/06/2016 2239   GFRNONAA >60 05/06/2016 2239   GFRAA >60 05/06/2016 2239   Estimated Creatinine Clearance: 69.6 mL/min (by C-G formula based on SCr of 0.78 mg/dL).  COAG No results found for: INR, PROTIME   Non-Invasive Vascular Imaging:   ABI 11/03/2015 Normal with  triphasic flow no evidence of stenosis   ABDOMEN ULTRASOUND COMPLETE Spleen: Size and appearance within normal limits. IMPRESSION: 1. Findings suggesting adenomyomatosis. No gallstones or biliary distention.  2. Echogenic liver suggesting fatty infiltration and/or hepatocellular disease.  CT scan 05/07/2016  Spleen: There is a 3.0 x 3.3 cm area of non enhancement involving the inferior pole of the spleen compatible with a focal splenic infarct. No perisplenic fluid collection, abscess, or hematoma.  Vascular/Lymphatic: There is moderate aortoiliac atherosclerotic disease. The abdominal  aorta and IVC are otherwise unremarkable. No portal venous gas identified. The splenic vein, SMV, and main portal vein are patent. There is no adenopathy.   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  She may need further cardiac work up and a work up for hypercoagulable diseases.   She is currently on Heparin IV    Clinton Gallant [facility] 05/07/2016 9:31 AM  History and exam findings as above.  Mild left upper quadrant pain.  Normal peripheral pulse exam. CT images reviewed.  Usually splenic infarcts are embolic in nature not hypercoagulable state and usually cardiac source not arterial arterial.  Doubt hypercoagulable state as this would be rare presentation.  Would obtain CT angio chest abdomen and pelvis in 48 hours after she has cleared her prior contrast load to rule out arterial source  Needs TEE to rule out cardiac source.  Spoke with Cardiology will keep NPO p midnight  Would check troponin to rule out occult MI  Agree with heparin for now but would stop if she has any evidenced of bleeding or other contraindication  Fabienne Bruns, MD [facility]  [place] Office: [phone] Pager: [phone]

## 2016-05-07 NOTE — Progress Notes (Signed)
ANTICOAGULATION CONSULT NOTE - Initial Consult  Pharmacy Consult for heparin Indication: splenic infarct w/ prior h/o VTE  No Known Allergies  Patient Measurements: Height: 5\' 3"  (160 cm) Weight: 169 lb 3.2 oz (76.7 kg) IBW/kg (Calculated) : 52.4 Heparin Dosing Weight: 70kg  Vital Signs: Temp: 98.3 F (36.8 C) (12/19 0519) Temp Source: Oral (12/19 0519) BP: 137/74 (12/19 0519) Pulse Rate: 83 (12/19 0519)  Labs:  Recent Labs  05/06/16 2239  HGB 13.1  HCT 39.3  PLT 304  CREATININE 0.78    Estimated Creatinine Clearance: 69.6 mL/min (by C-G formula based on SCr of 0.78 mg/dL).   Medical History: Past Medical History:  Diagnosis Date  . COPD (chronic obstructive pulmonary disease) (HCC)   . Diabetes mellitus without complication (HCC)   . DVT (deep venous thrombosis) (HCC)   . Gastroesophageal reflux   . Hyperlipidemia   . Hypertension   . Peripheral vascular disease (HCC)   . Vitamin D deficiency     Medications:  Prescriptions Prior to Admission  Medication Sig Dispense Refill Last Dose  . albuterol (PROVENTIL HFA) 108 (90 Base) MCG/ACT inhaler Inhale 2 puffs into the lungs every 4 (four) hours as needed for wheezing or shortness of breath.   Taking  . amLODipine (NORVASC) 2.5 MG tablet Take 2.5 mg by mouth daily.   Taking  . atorvastatin (LIPITOR) 10 MG tablet Take 10 mg by mouth daily.   Taking  . Fluticasone-Salmeterol (ADVAIR) 250-50 MCG/DOSE AEPB Inhale 1 puff into the lungs 2 (two) times daily.   Taking  . metFORMIN (GLUCOPHAGE-XR) 750 MG 24 hr tablet Take 750 mg by mouth daily with breakfast.   Taking  . varenicline (CHANTIX) 1 MG tablet Take 1 mg by mouth 2 (two) times daily.   Taking    Assessment: 65yo female c/o abdominal pain and vomiting, CT reveals splenic infarct, has h/o VTE but no longer on anticoag, suspected thrombotic etiology.  Goal of Therapy:  Heparin level 0.3-0.7 units/ml Monitor platelets by anticoagulation protocol: Yes   Plan:   Will give heparin 4000 units bolus followed by gtt at 1100 units/hr and monitor heparin levels and CBC.  Vernard GamblesVeronda Agueda Houpt, PharmD, BCPS  05/07/2016,7:21 AM

## 2016-05-07 NOTE — H&P (Signed)
History and Physical    Sydney GarinDiana Weyland NFA:[id number] DOB: Jan 07, 1951 DOA: 05/06/2016   PCP: Lilia ArgueKAPLAN,KRISTEN, PA-C   Patient coming from/Resides with: Private residence/lives with husband  Admission status: Observation/telemetry -it may be medically necessary to stay a minimum 2 midnights to rule out impending and/or unexpected changes in physiologic status that may differ from initial evaluation performed in the ER and/or at time of admission these consider reevaluation of admission status within 24 hours.   Chief Complaint: LUQ abdominal pain  HPI: Sydney Herring is a 65 y.o. 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 She has been off of anticoagulation for greater than 6 years. She also has a history of former tobacco abuse with resultant COPD, diabetes on metformin and dyslipidemia. Patient reports that she has been having nonspecific waxing and waning upper abdominal pain for about 8 months no particular pattern. Of late it has increased was below the left chest would move to the center in through to the back. In the past 24 hours the pain had worsened was associated with nausea and vomiting 2 episodes on 12/18. She has not had any blood in the emesis and has not had any diarrhea. She presented to the ER because of the severity of the pain.   ED Course:  Vital Signs: BP 137/74 (BP Location: Right Arm)   Pulse 83   Temp 98.3 F (36.8 C) (Oral)   Resp 17   Ht 5\' 3"  (1.6 m)   Wt 76.7 kg (169 lb 3.2 oz)   SpO2 100%   BMI 29.97 kg/m   CT abdomen and pelvis with contrast: Focal splenic infarct involving the inferior pole without fluid collection abscess or hematoma. Fatty liver, sigmoid diverticulosis without diverticulitis and no other acute findings. Lab data: Sodium 138, potassium 3.9, CO2 24, BUN 21,  creatinine 0.78, glucose 182, LFTs normal, white count 8700 neutrophils 81% and absolute neutrophils 7%, urinalysis with rare bacteria, 250 glucose, trace hemoglobin, negative for leukocytes or nitrite, mucus present, protein 30, specific gravity 1.036, WBC 0-5, blood cultures obtained in the ER. Medications and treatments: Normal saline bolus 1 L, Zofran 4 mg IV 1, morphine 4 mg IV 2, Toradol 30 mg IV 1  Review of Systems:  In addition to the HPI above,  No Fever-chills, myalgias or other constitutional symptoms No Headache, changes with Vision or hearing, new weakness, tingling, numbness in any extremity, dizziness, dysarthria or word finding difficulty, gait disturbance or imbalance, tremors or seizure activity No problems swallowing food or Liquids, indigestion/reflux, choking or coughing while eating, abdominal pain with or after eating No Chest pain, Cough or Shortness of Breath, palpitations, orthopnea or DOE No melena,hematochezia, dark tarry stools, constipation No dysuria, malodorous urine, hematuria or flank pain No new skin rashes, lesions, masses or bruises, No new joint pains, aches, swelling or redness No recent unintentional weight gain or loss No polyuria, polydypsia or polyphagia   Past Medical History:  Diagnosis Date  . COPD (chronic obstructive pulmonary disease) (HCC)   . Diabetes mellitus without complication (HCC)   . DVT (deep venous thrombosis) (HCC)   . Gastroesophageal reflux   . Hyperlipidemia   . Hypertension   . Peripheral vascular disease (HCC)   . Vitamin D deficiency     Past Surgical History:  Procedure Laterality Date  . ELBOW SURGERY    . EYE  SURGERY    . WISDOM TOOTH EXTRACTION      Social History   Social History  . Marital status: Married    Spouse name: N/A  . Number of children: N/A  . Years of education: N/A   Occupational History  . Not on file.   Social History Main Topics  . Smoking status: Former Smoker    Quit date:  08/19/2015  . Smokeless tobacco: Never Used  . Alcohol use 0.0 oz/week     Comment: social  . Drug use: No  . Sexual activity: Not on file   Other Topics Concern  . Not on file   Social History Narrative  . No narrative on file    Mobility: Without assistive devices Work history: Retired; previously worked as an Field seismologist   No Known Allergies  Family history reviewed and not pertinent current admission findings and evaluation. Patient denies any family history of coagulopathies. Her mother had history of diabetes and vascular disease  Prior to Admission medications   Medication Sig Start Date End Date Taking? Authorizing Provider  albuterol (PROVENTIL HFA) 108 (90 Base) MCG/ACT inhaler Inhale 2 puffs into the lungs every 4 (four) hours as needed for wheezing or shortness of breath.   Yes Historical Provider, MD  amLODipine (NORVASC) 2.5 MG tablet Take 2.5 mg by mouth daily.   Yes Historical Provider, MD  atorvastatin (LIPITOR) 10 MG tablet Take 10 mg by mouth daily.   Yes Historical Provider, MD  Fluticasone-Salmeterol (ADVAIR) 250-50 MCG/DOSE AEPB Inhale 1 puff into the lungs 2 (two) times daily.   Yes Historical Provider, MD  metFORMIN (GLUCOPHAGE-XR) 750 MG 24 hr tablet Take 750 mg by mouth daily with breakfast.   Yes Historical Provider, MD  varenicline (CHANTIX) 1 MG tablet Take 1 mg by mouth 2 (two) times daily.    Historical Provider, MD    Physical Exam: Vitals:   05/07/16 0001 05/07/16 0244 05/07/16 0403 05/07/16 0519  BP: 144/84 136/89 145/78 137/74  Pulse: 82 81  83  Resp: 18 16 16 17   Temp:  98.1 F (36.7 C) 97.9 F (36.6 C) 98.3 F (36.8 C)  TempSrc:    Oral  SpO2: 100% 97% 97% 100%  Weight:    76.7 kg (169 lb 3.2 oz)  Height:    5\' 3"  (1.6 m)      Constitutional: NAD, calm, comfortable Eyes: PERRL, lids and conjunctivae normal ENMT: Mucous membranes are moist. Posterior pharynx clear of any exudate or lesions.Normal dentition.  Neck: normal,  supple, no masses, no thyromegaly Respiratory: clear to auscultation bilaterally, no wheezing, no crackles. Normal respiratory effort. No accessory muscle use.  Cardiovascular: Regular rate and rhythm, no murmurs / rubs / gallops. No extremity edema. 2+ pedal pulses. No carotid bruits.  Abdomen: Mildly tender over right upper quadrant that guarding or rebounding, no masses palpated. No hepatosplenomegaly. Bowel sounds positive.  Musculoskeletal: no clubbing / cyanosis. No joint deformity upper and lower extremities. Good ROM, no contractures. Normal muscle tone.  Skin: no rashes, lesions, ulcers. No induration Neurologic: CN 2-12 grossly intact. Sensation intact, DTR normal. Strength 5/5 x all 4 extremities.  Psychiatric: Normal judgment and insight. Alert and oriented x 3. Normal mood.    Labs on Admission: I have personally reviewed following labs and imaging studies  CBC:  Recent Labs Lab 05/06/16 2239  WBC 8.7  NEUTROABS 7.0  HGB 13.1  HCT 39.3  MCV 89.7  PLT 304   Basic Metabolic Panel:  Recent  Labs Lab 05/06/16 2239  NA 138  K 3.9  CL 102  CO2 24  GLUCOSE 182*  BUN 21*  CREATININE 0.78  CALCIUM 9.1   GFR: Estimated Creatinine Clearance: 69.6 mL/min (by C-G formula based on SCr of 0.78 mg/dL). Liver Function Tests:  Recent Labs Lab 05/06/16 2239  AST 24  ALT 32  ALKPHOS 81  BILITOT 1.0  PROT 7.9  ALBUMIN 4.7    Recent Labs Lab 05/06/16 2239  LIPASE 21   No results for input(s): AMMONIA in the last 168 hours. Coagulation Profile: No results for input(s): INR, PROTIME in the last 168 hours. Cardiac Enzymes: No results for input(s): CKTOTAL, CKMB, CKMBINDEX, TROPONINI in the last 168 hours. BNP (last 3 results) No results for input(s): PROBNP in the last 8760 hours. HbA1C: No results for input(s): HGBA1C in the last 72 hours. CBG:  Recent Labs Lab 05/07/16 0630 05/07/16 0812  GLUCAP 127* 142*   Lipid Profile: No results for input(s): CHOL,  HDL, LDLCALC, TRIG, CHOLHDL, LDLDIRECT in the last 72 hours. Thyroid Function Tests: No results for input(s): TSH, T4TOTAL, FREET4, T3FREE, THYROIDAB in the last 72 hours. Anemia Panel: No results for input(s): VITAMINB12, FOLATE, FERRITIN, TIBC, IRON, RETICCTPCT in the last 72 hours. Urine analysis:    Component Value Date/Time   COLORURINE YELLOW 05/06/2016 2129   APPEARANCEUR CLEAR 05/06/2016 2129   LABSPEC 1.036 (H) 05/06/2016 2129   PHURINE 6.0 05/06/2016 2129   GLUCOSEU 250 (A) 05/06/2016 2129   HGBUR TRACE (A) 05/06/2016 2129   BILIRUBINUR NEGATIVE 05/06/2016 2129   KETONESUR NEGATIVE 05/06/2016 2129   PROTEINUR 30 (A) 05/06/2016 2129   NITRITE NEGATIVE 05/06/2016 2129   LEUKOCYTESUR NEGATIVE 05/06/2016 2129   Sepsis Labs: @LABRCNTIP (procalcitonin:4,lacticidven:4) )No results found for this or any previous visit (from the past 240 hour(s)).   Radiological Exams on Admission: Ct Abdomen Pelvis W Contrast  Result Date: 05/07/2016 CLINICAL DATA:  65 year old female with diffuse abdominal pain worse in the left upper quadrant. EXAM: CT ABDOMEN AND PELVIS WITH CONTRAST TECHNIQUE: Multidetector CT imaging of the abdomen and pelvis was performed using the standard protocol following bolus administration of intravenous contrast. CONTRAST:  100mL ISOVUE-300 IOPAMIDOL (ISOVUE-300) INJECTION 61% COMPARISON:  Abdominal ultrasound dated 08/24/2015 FINDINGS: Lower chest: The visualized lung bases are clear. Coronary vascular calcification involving the LAD. No intra-abdominal free air or free fluid. Hepatobiliary: Mild diffuse fatty infiltration of the liver. Of the liver is otherwise unremarkable. No intrahepatic biliary ductal dilatation. There is irregular and thickened appearance of the gallbladder fundus likely adenomyomatosis. No gallstones. Pancreas: Unremarkable. No pancreatic ductal dilatation or surrounding inflammatory changes. Spleen: There is a 3.0 x 3.3 cm area of non enhancement  involving the inferior pole of the spleen compatible with a focal splenic infarct. No perisplenic fluid collection, abscess, or hematoma. Adrenals/Urinary Tract: The adrenal glands appear unremarkable. The kidneys, visualized ureters, and urinary bladder are grossly unremarkable. Stomach/Bowel: There is a small hiatal hernia with gastroesophageal reflux. Oral contrast opacifies the stomach and multiple loops of small bowel traverses into the colon. No evidence of bowel obstruction or active inflammation. There is sigmoid diverticulosis with muscular hypertrophy. No definite active inflammatory changes identified. Normal appendix. Vascular/Lymphatic: There is moderate aortoiliac atherosclerotic disease. The abdominal aorta and IVC are otherwise unremarkable. No portal venous gas identified. The splenic vein, SMV, and main portal vein are patent. There is no adenopathy. Reproductive: The uterus and ovaries are grossly unremarkable. Other: None Musculoskeletal: No acute or significant osseous findings. IMPRESSION: Focal  splenic infarct involving the inferior pole of the spleen. No fluid collection/ abscess or hematoma. Fatty liver. Sigmoid diverticulosis. No evidence of bowel obstruction or active inflammation. Normal appendix. Electronically Signed   By: Elgie Collard M.D.   On: 05/07/2016 02:16    EKG: (Independently reviewed) sinus rhythm with ventricular rate 86 bpm, QTC 500 ms in setting of frequent unifocal PVCs, also a finding of RSR' V1 and V2 possibly concerning for right plantar hypertrophy otherwise no acute ischemic findings  Assessment/Plan Principal Problem:   Splenic infarct -No clear-cut etiology; no definitive signs of abscess or hematoma or infection -Blood cultures obtained in the ER -Concerning for possible thrombotic etiology though have initiated empiric IV heparin -Hyper coagulopathy panel -VVS consultation -Likely will need either CT angiogram or MR angiogram of abdomen to  evaluate for vaso occlusive disease-defer to vascular surgery (see below regarding rationale for IV fluids) -Received IV contrast with CT in ER so will administer normal saline at 75 mL per hour -Clear liquid diet until evaluated by vascular surgery -Vicodin for moderate pain and IV morphine for severe pain  Active Problems:   Chronic deep vein thrombosis (DVT) of popliteal vein of left lower extremity -Followed by Dr. Chen/VVS outpatient setting -No further evidence of recurrent DVT and has been off anticoagulation for 6 years    Diabetes mellitus type 2 in obese  -Hold metformin in the acute setting and after receipt of IV contrast -HgbA1c -Follow CBGs -SSI    HTN -Continue Norvasc    COPD (chronic obstructive pulmonary disease)  -Currently not actively wheezing and appears compensated    Tobacco abuse, in remission -Quit smoking 8 months ago    HLD (hyperlipidemia) -Cont Lipitor      DVT prophylaxis: Full dose IV heparin  Code Status: Full Family Communication: No family at bedside Disposition Plan: Despite discharge back to preadmission home apartment once medically stable Consults called: [facility]     Sible Straley L. ANP-BC [facility] Pager [phone]   If 7PM-7AM, please contact night-coverage [email or web address] Password [email or web address]  05/07/2016, 9:15 AM

## 2016-05-07 NOTE — Progress Notes (Signed)
ANTICOAGULATION CONSULT NOTE - Initial Consult  Pharmacy Consult for heparin Indication: splenic infarct w/ prior h/o VTE  No Known Allergies  Patient Measurements: Height: 5\' 3"  (160 cm) Weight: 169 lb 3.2 oz (76.7 kg) IBW/kg (Calculated) : 52.4 Heparin Dosing Weight: 70kg  Vital Signs: Temp: 98.3 F (36.8 C) (12/19 1355) Temp Source: Oral (12/19 1355) BP: 125/62 (12/19 1355) Pulse Rate: 70 (12/19 1355)  Labs:  Recent Labs  05/06/16 2239 05/07/16 1359  HGB 13.1  --   HCT 39.3  --   PLT 304  --   HEPARINUNFRC  --  0.43  CREATININE 0.78  --     Estimated Creatinine Clearance: 69.6 mL/min (by C-G formula based on SCr of 0.78 mg/dL).   Medical History: Past Medical History:  Diagnosis Date  . COPD (chronic obstructive pulmonary disease) (HCC)   . Diabetes mellitus without complication (HCC)   . DVT (deep venous thrombosis) (HCC)   . Gastroesophageal reflux   . Hyperlipidemia   . Hypertension   . Peripheral vascular disease (HCC)   . 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 Vitamin D deficiency     Medications:  Prescriptions Prior to Admission  Medication Sig Dispense Refill Last Dose  . albuterol (PROVENTIL HFA) 108 (90 Base) MCG/ACT inhaler Inhale 2 puffs into the lungs every 4 (four) hours as needed for wheezing or shortness of breath.   Past Week at Unknown time  . amLODipine (NORVASC) 2.5 MG tablet Take 2.5 mg by mouth daily.   05/06/2016 at Unknown time  . atorvastatin (LIPITOR) 10 MG tablet Take 10 mg by mouth daily.   05/06/2016 at Unknown time  . Fluticasone-Salmeterol (ADVAIR) 250-50 MCG/DOSE AEPB Inhale 1 puff into the lungs 2 (two) times daily as needed (for shortness of breath).    Past Week at Unknown time  . lansoprazole (PREVACID) 15 MG capsule Take 15 mg by mouth daily.   05/06/2016 at Unknown time  . metFORMIN (GLUCOPHAGE-XR) 750 MG 24 hr tablet Take 750 mg by mouth daily with breakfast.   05/06/2016 at Unknown time    Assessment: 65yo female c/o  abdominal pain and vomiting, CT reveals splenic infarct, has h/o VTE but no longer on anticoag, suspected thrombotic etiology. Last HL therapeutic at 0.43. CBC stable, no s/s of bleed.  Goal of Therapy:  Heparin level 0.3-0.7 units/ml Monitor platelets by anticoagulation protocol: Yes   Plan:  Continue heparin gtt at 1,100 units/hr Monitor daily HL, CBC, s/s of bleed  Enzo BiNathan Paytience Bures, PharmD, [facility] CenterBCPS Clinical Pharmacist Pager [phone] 05/07/2016 5:44 PM

## 2016-05-07 NOTE — ED Notes (Signed)
Pt ambulatory to BR without assistance.  

## 2016-05-07 NOTE — Progress Notes (Signed)
65 yo F with hx of COPD, DM, and [facility] with [facility].  Labs unremarkable but CT shows new splenic infarct.  BP 136/89   Pulse 81   Temp 98.1 F (36.7 C)   Resp 16   Ht 5\' 3"  (1.6 m)   Wt 71.7 kg (158 lb)   SpO2 97%   BMI 27.99 kg/m   WBC, Hgb normal.  LFTs normal.  Creatinine normal.  Will place in observation for w/u for splenic infarct.  To tele OBS.

## 2016-05-08 ENCOUNTER — Encounter (HOSPITAL_COMMUNITY)
Admission: EM | Disposition: A | Discharge status: Home / Self Care | Payer: Self-pay | Source: Home / Self Care | Attending: Family Medicine

## 2016-05-08 ENCOUNTER — Inpatient Hospital Stay (HOSPITAL_COMMUNITY): Payer: Managed Care, Other (non HMO)

## 2016-05-08 ENCOUNTER — Encounter (HOSPITAL_COMMUNITY): Payer: Self-pay | Admitting: *Deleted

## 2016-05-08 HISTORY — PX: TEE WITHOUT CARDIOVERSION: SHX5443

## 2016-05-08 LAB — PROTEIN S, TOTAL: Protein S Ag, Total: 88 % (ref 60–150)

## 2016-05-08 LAB — GLUCOSE, CAPILLARY
GLUCOSE-CAPILLARY: 114 mg/dL — AB (ref 65–99)
Glucose-Capillary: 146 mg/dL — ABNORMAL HIGH (ref 65–99)
Glucose-Capillary: 91 mg/dL (ref 65–99)
Glucose-Capillary: 119 mg/dL — ABNORMAL HIGH (ref 65–99)

## 2016-05-08 LAB — PROTEIN C ACTIVITY: PROTEIN C ACTIVITY: 122 % (ref 73–180)

## 2016-05-08 LAB — CBC
HEMATOCRIT: 36.4 % (ref 36.0–46.0)
Hemoglobin: 12 g/dL (ref 12.0–15.0)
MCH: 29.9 pg (ref 26.0–34.0)
MCHC: 33 g/dL (ref 30.0–36.0)
MCV: 90.8 fL (ref 78.0–100.0)
PLATELETS: 288 10*3/uL (ref 150–400)
RBC: 4.01 MIL/uL (ref 3.87–5.11)
RDW: 14.4 % (ref 11.5–15.5)
WBC: 5.9 10*3/uL (ref 4.0–10.5)

## 2016-05-08 LAB — BASIC METABOLIC PANEL
Anion gap: 5 (ref 5–15)
BUN: 7 mg/dL (ref 6–20)
CHLORIDE: 107 mmol/L (ref 101–111)
CO2: 29 mmol/L (ref 22–32)
CREATININE: 0.73 mg/dL (ref 0.44–1.00)
Calcium: 8.4 mg/dL — ABNORMAL LOW (ref 8.9–10.3)
GFR calc Af Amer: >60 mL/min (ref >60)
GFR calc non Af Amer: >60 mL/min (ref >60)
GLUCOSE: 154 mg/dL — AB (ref 65–99)
POTASSIUM: 3.7 mmol/L (ref 3.5–5.1)
Sodium: 141 mmol/L (ref 135–145)

## 2016-05-08 LAB — LUPUS ANTICOAGULANT PANEL
DRVVT: 32.6 s (ref 0.0–47.0)
PTT Lupus Anticoagulant: 36.1 s (ref 0.0–51.9)

## 2016-05-08 LAB — TROPONIN I: Troponin I: <0.03 ng/mL (ref <0.03)

## 2016-05-08 LAB — HEPARIN LEVEL (UNFRACTIONATED): Heparin Unfractionated: 0.56 IU/mL (ref 0.30–0.70)

## 2016-05-08 LAB — PROTEIN S ACTIVITY: Protein S Activity: 114 % (ref 63–140)

## 2016-05-08 LAB — HOMOCYSTEINE: Homocysteine: 8.4 umol/L (ref 0.0–15.0)

## 2016-05-08 SURGERY — ECHOCARDIOGRAM, TRANSESOPHAGEAL
Anesthesia: Moderate Sedation

## 2016-05-08 MED ORDER — MIDAZOLAM HCL 10 MG/2ML IJ SOLN
INTRAMUSCULAR | Status: DC | PRN
  Administered 2016-05-08: 1 mg via INTRAVENOUS
  Administered 2016-05-08: 2 mg via INTRAVENOUS

## 2016-05-08 MED ORDER — FENTANYL CITRATE (PF) 100 MCG/2ML IJ SOLN
INTRAMUSCULAR | Status: DC | PRN
  Administered 2016-05-08: 25 ug via INTRAVENOUS
  Administered 2016-05-08: 50 ug via INTRAVENOUS

## 2016-05-08 MED ORDER — FENTANYL CITRATE (PF) 100 MCG/2ML IJ SOLN
INTRAMUSCULAR | Status: AC
  Filled 2016-05-08: qty 2

## 2016-05-08 MED ORDER — MIDAZOLAM HCL 5 MG/ML IJ SOLN
INTRAMUSCULAR | Status: AC
  Filled 2016-05-08: qty 2

## 2016-05-08 MED ORDER — SODIUM CHLORIDE 0.9 % IV SOLN
INTRAVENOUS | Status: DC
  Administered 2016-05-08: 500 mL via INTRAVENOUS

## 2016-05-08 NOTE — Progress Notes (Signed)
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 Darrick Pennafields Dr. fields is of the opinion that hypercoagulable panel and other workup may not be indicated in this case and is recommending a CT of the chest -would broaden search for malignancy, autoimmune, Hemoglobinopathy if Ct scan r TEE not showing any cause Needs TEE and have discussed with Dr. Algie CofferKadakia to see if he would be willing to do the same Continue IV heparin as long as no overt bleeding  Chronic DVT Stable  Diabetes mellitus type 2 HbA1c this admission. Sugars ranging between 150 and 130  HTn Cont Norvasc 2.5  COPD/former smoker Cont Dulera 2 puff bid  HLd    DVT prophylaxis: anticoagulated Code Status: Full Family Communication: None present at the bedside Disposition Plan: Change to inpatient pending resolution   Consultants:   Cardiology  Vascular  Procedures:   CT chest is pending  Antimicrobials:   None currently    Subjective:  Well Pain is improved in the LUQ No blurred no double vision   Objective: Vitals:   05/07/16 0519 05/07/16 1355 05/07/16 2149  05/08/16 0535  BP: 137/74 125/62 113/63 122/70  Pulse: 83 70 72 96  Resp: 17 18 18 17   Temp: 98.3 F (36.8 C) 98.3 F (36.8 C) 98.2 F (36.8 C) 97.9 F (36.6 C)  TempSrc: Oral Oral Oral   SpO2: 100% 99% 97% 99%  Weight: 76.7 kg (169 lb 3.2 oz)     Height: 5\' 3"  (1.6 m)       Intake/Output Summary (Last 24 hours) at 05/08/16 1100 Last data filed at 05/08/16 0900  Gross per 24 hour  Intake             1997 ml  Output              400 ml  Net             1597 ml   Filed Weights   05/06/16 2005 05/07/16 0519  Weight: 71.7 kg (158 lb) 76.7 kg (169 lb 3.2 oz)    Examination:  General exam: Calm and comfortable  Respiratory system: Clear to auscultation. Respiratory effort normal. Cardiovascular system: S1 & S2 heard, RRR. No JVD, murmurs, rubs, gallops or clicks. No pedal edema. Gastrointestinal system: Abdomen is nondistended, soft and nontender.  Central nervous system: Alert and oriented. No focal neurological deficits. Extremities: Symmetric 5 x 5 power. Skin: No rashes, lesions or ulcers Psychiatry: Judgement and insight appear normal. Mood & affect appropriate.     Data Reviewed: I have personally reviewed following labs and imaging studies  CBC:  Recent Labs Lab 05/06/16 2239 05/08/16 [id number]  WBC 8.7 5.9  NEUTROABS 7.0  --   HGB 13.1 12.0  HCT 39.3 36.4  MCV 89.7 90.8  PLT 304 288   Basic Metabolic Panel:  Recent Labs Lab 05/06/16 2239 05/07/16 1359 05/08/16 0823  NA 138  --  141  K 3.9  --  3.7  CL 102  --  107  CO2 24  --  29  GLUCOSE 182*  --  154*  BUN 21*  --  7  CREATININE 0.78  --  0.73  CALCIUM 9.1  --  8.4*  MG  --  1.9  --    GFR: Estimated Creatinine Clearance: 69.6 mL/min (by C-G formula based on SCr of 0.73 mg/dL). Liver Function Tests:  Recent Labs Lab 05/06/16 2239  AST 24  ALT 32  ALKPHOS 81  BILITOT 1.0  PROT 7.9  ALBUMIN 4.7    Recent Labs Lab 05/06/16 2239  LIPASE 21   No results for input(s): AMMONIA in the  last 168 hours. Coagulation Profile: No results for input(s): INR, PROTIME in the last 168 hours. Cardiac Enzymes:  Recent Labs Lab 05/07/16 1703 05/08/16 0200 05/08/16 0823  TROPONINI <0.03 <0.03 <0.03   BNP (last 3 results) No results for input(s): PROBNP in the last 8760 hours. HbA1C:  Recent Labs  05/07/16 0808  HGBA1C 8.7*   CBG:  Recent Labs Lab 05/07/16 0812 05/07/16 1130 05/07/16 1649 05/07/16 2144 05/08/16 0710  GLUCAP 142* 147* 117* 100* 146*   Lipid Profile: No results for input(s): CHOL, HDL, LDLCALC, TRIG, CHOLHDL, LDLDIRECT in the last 72 hours. Thyroid Function Tests: No results for input(s): TSH, T4TOTAL, FREET4, T3FREE, THYROIDAB in the last 72 hours. Anemia Panel: No results for input(s): VITAMINB12, FOLATE, FERRITIN, TIBC, IRON, RETICCTPCT in the last 72 hours. Sepsis Labs: No results for input(s): PROCALCITON, LATICACIDVEN in the last 168 hours.  No results found for this or any previous visit (from the past 240 hour(s)).       Radiology Studies: Ct Abdomen Pelvis W Contrast  Result Date: 05/07/2016 CLINICAL DATA:  65 year old female with diffuse abdominal pain worse in the left upper quadrant. EXAM: CT ABDOMEN AND PELVIS WITH CONTRAST TECHNIQUE: Multidetector CT imaging of the abdomen and pelvis was performed using the standard protocol following bolus administration of intravenous contrast. CONTRAST:  100mL ISOVUE-300 IOPAMIDOL (ISOVUE-300) INJECTION 61% COMPARISON:  Abdominal ultrasound dated 08/24/2015 FINDINGS: Lower chest: The visualized lung bases are clear. Coronary vascular calcification involving the LAD. No intra-abdominal free air or free fluid. Hepatobiliary: Mild diffuse fatty infiltration of the liver. Of the liver is otherwise unremarkable. No intrahepatic biliary ductal dilatation. There is irregular and thickened appearance of the gallbladder fundus likely adenomyomatosis. No gallstones. Pancreas: Unremarkable. No pancreatic  ductal dilatation or surrounding inflammatory changes. Spleen: There is a 3.0 x 3.3 cm area of non enhancement involving the inferior pole of the spleen compatible with a focal splenic infarct. No perisplenic fluid collection, abscess, or hematoma. Adrenals/Urinary Tract: The adrenal glands appear unremarkable. The kidneys, visualized ureters, and urinary bladder are grossly unremarkable. Stomach/Bowel: There is a small hiatal hernia with gastroesophageal reflux. Oral contrast opacifies the stomach and multiple loops of small bowel traverses into the colon. No evidence of bowel obstruction or active inflammation. There is sigmoid diverticulosis with muscular hypertrophy. No definite active inflammatory changes identified. Normal appendix. Vascular/Lymphatic: There is moderate aortoiliac atherosclerotic disease. The abdominal aorta and IVC are otherwise unremarkable. No portal venous gas identified. The splenic vein, SMV, and main portal vein are  patent. There is no adenopathy. Reproductive: The uterus and ovaries are grossly unremarkable. Other: None Musculoskeletal: No acute or significant osseous findings. 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 No fluid collection/ abscess or hematoma. Fatty liver. Sigmoid diverticulosis. No evidence of bowel obstruction or active inflammation. Normal appendix. Electronically Signed   By: Elgie Collard M.D.   On: 05/07/2016 02:16        Scheduled Meds: . [MAR Hold] amLODipine  2.5 mg Oral Daily  . [MAR Hold] atorvastatin  10 mg Oral q1800  . [MAR Hold] insulin aspart  0-5 Units Subcutaneous QHS  . [MAR Hold] insulin aspart  0-9 Units Subcutaneous TID WC  . [MAR Hold] mometasone-formoterol  2 puff Inhalation BID   Continuous Infusions: . sodium chloride 75 mL/hr at 05/08/16 0029  . heparin 1,100 Units/hr (05/08/16 0324)     LOS: 1 day    Time spent: 25    Rhetta Mura, MD [facility] Pager  [phone]  If 7PM-7AM, please contact night-coverage [email or web address] Password [email or web address] 05/08/2016, 11:00 AM

## 2016-05-08 NOTE — Progress Notes (Signed)
ANTICOAGULATION CONSULT NOTE - Follow Up Consult  Pharmacy Consult for Heparin Indication: Splenic infarct with prior hx VTE  No Known Allergies  Patient Measurements: Height: 5\' 3"  (160 cm) Weight: 169 lb 3.2 oz (76.7 kg) IBW/kg (Calculated) : 52.4 Heparin Dosing Weight:   Vital Signs: Temp: 97.9 F (36.6 C) (12/20 0535) Temp Source: Oral (12/19 2149) BP: 122/70 (12/20 0535) Pulse Rate: 96 (12/20 0535)  Labs:  Recent Labs  05/06/16 2239 05/07/16 1359 05/07/16 1703 05/08/16 0200 05/08/16 0220 05/08/16 0823  HGB 13.1  --   --   --   --  12.0  HCT 39.3  --   --   --   --  36.4  PLT 304  --   --   --   --  288  HEPARINUNFRC  --  0.43  --   --  0.56  --   CREATININE 0.78  --   --   --   --   --   TROPONINI  --   --  <0.03 <0.03  --   --     Estimated Creatinine Clearance: 69.6 mL/min (by C-G formula based on SCr of 0.78 mg/dL).   Medications:  Heparin @ 1100 units/hr (11 ml/hr)  Assessment: [name] who c/o abdominal pain and vomiting, CT reveals splenic infarct, has h/o VTE but no longer on anticoag, suspected thrombotic etiology.Getting work-up completed. Pharmacy consulted to dose Heparin for anticoagulation.  Heparin level this morning remains therapeutic (HL 0.56 << 0.43, goal of 0.3-0.7). CBC wnl and stable - no overt s/sx of bleeding noted at this time.   Goal of Therapy:  Heparin level 0.3-0.7 units/ml Monitor platelets by anticoagulation protocol: Yes   Plan:  1. Continue Heparin at 1100 units/hr (11 ml/hr) 2. Will continue to monitor for any signs/symptoms of bleeding and will follow up with heparin level in the a.m.   Thank you for allowing pharmacy to be a part of this patient's care.  Sydney PillionElizabeth Vincy Herring, PharmD, BCPS Clinical Pharmacist Pager: [phone] Clinical phone for 05/08/2016 from 7a-3:30p: [phone] If after 3:30p, please call main pharmacy at: [phone] 05/08/2016 9:19 AM

## 2016-05-08 NOTE — CV Procedure (Signed)
INDICATIONS:   The patient is 65 year old with splenic infarct and h/o DVT in past.  PROCEDURE:  Informed consent was discussed including risks, benefits and alternatives for the procedure.  Risks include, but are not limited to, cough, sore throat, vomiting, nausea, somnolence, esophageal and stomach trauma or perforation, bleeding, low blood pressure, aspiration, pneumonia, infection, trauma to the teeth and death.    Patient was given sedation.  The oropharynx was anesthetized with topical lidocaine.  The transesophageal probe was inserted in the esophagus and stomach and multiple views were obtained.  Agitated saline was used after the transesophageal probe was removed from the body.  The patient was kept under observation until the patient left the procedure room.  The patient left the procedure room in stable condition.   COMPLICATIONS:  There were no immediate complications.  FINDINGS:  1. LEFT VENTRICLE: The left ventricle is normal in structure and function.  Wall motion is near normal with EF 50-55 %.  No thrombus or masses seen in the left ventricle.  2. RIGHT VENTRICLE:  The right ventricle is normal in structure and function without any thrombus or masses.    3. LEFT ATRIUM:  The left atrium is normal without any thrombus or masses.  4. LEFT ATRIAL APPENDAGE:  The left atrial appendage is free of any thrombus or masses.  5. RIGHT ATRIUM:  The right atrium is free of any thrombus or masses.    6. ATRIAL SEPTUM:  The atrial septum is normal without any ASD or PFO. Negative sonication saline injection study.  7. MITRAL VALVE:  The mitral valve is normal in structure and function with multiple jets of moderate regurgitation, no masses, stenosis or vegetations.  8. TRICUSPID VALVE:  The tricuspid valve is normal in structure and function with trace to mild regurgitation, no masses, stenosis or vegetations.  9. AORTIC VALVE:  The aortic valve is normal in structure and function  without regurgitation, masses, stenosis or vegetations.  10. PULMONIC VALVE:  The pulmonic valve is normal in structure and function without regurgitation, masses, stenosis or vegetations.  11. AORTIC ARCH, ASCENDING AND DESCENDING AORTA:  The aorta had mild atherosclerosis in the descending aorta.  The aortic arch had moderate atherosclerosis.  12.  Superior Vena Cava : No thrombus or catheter.  13.  Pulmonary Veins: Visible, bilaterally.  14.  Pulmonary artery: visible and normal.   IMPRESSION:   1. Preserved LV systolic function. EF 50-55 % 2. Moderate MR. 3. Mild TR. 4. Moderate atherosclerosis of aortic arch and mild atherosclerosis of descending aorta. 5. No ASD or PFO.  RECOMMENDATIONS:    Medical treatment with Statins and anticoagulation.

## 2016-05-08 NOTE — Progress Notes (Signed)
  Echocardiogram Echocardiogram Transesophageal has been performed.  Nolon RodBrown, Tony 05/08/2016, 4:45 PM

## 2016-05-08 NOTE — H&P (View-Only) (Signed)
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 Darrick Pennafields Dr. fields is of the opinion that hypercoagulable panel and other workup may not be indicated in this case and is recommending a CT of the chest -would broaden search for malignancy, autoimmune, Hemoglobinopathy if Ct scan r TEE not showing any cause Needs TEE and have discussed with Dr. Algie CofferKadakia to see if he would be willing to do the same Continue IV heparin as long as no overt bleeding  Chronic DVT Stable  Diabetes mellitus type 2 HbA1c this admission. Sugars ranging between 150 and 130  HTn Cont Norvasc 2.5  COPD/former smoker Cont Dulera 2 puff bid  HLd    DVT prophylaxis: anticoagulated Code Status: Full Family Communication: None present at the bedside Disposition Plan: Change to inpatient pending resolution   Consultants:   Cardiology  Vascular  Procedures:   CT chest is pending  Antimicrobials:   None currently    Subjective:  Well Pain is improved in the LUQ No blurred no double vision   Objective: Vitals:   05/07/16 0519 05/07/16 1355 05/07/16 2149  05/08/16 0535  BP: 137/74 125/62 113/63 122/70  Pulse: 83 70 72 96  Resp: 17 18 18 17   Temp: 98.3 F (36.8 C) 98.3 F (36.8 C) 98.2 F (36.8 C) 97.9 F (36.6 C)  TempSrc: Oral Oral Oral   SpO2: 100% 99% 97% 99%  Weight: 76.7 kg (169 lb 3.2 oz)     Height: 5\' 3"  (1.6 m)       Intake/Output Summary (Last 24 hours) at 05/08/16 1100 Last data filed at 05/08/16 0900  Gross per 24 hour  Intake             1997 ml  Output              400 ml  Net             1597 ml   Filed Weights   05/06/16 2005 05/07/16 0519  Weight: 71.7 kg (158 lb) 76.7 kg (169 lb 3.2 oz)    Examination:  General exam: Calm and comfortable  Respiratory system: Clear to auscultation. Respiratory effort normal. Cardiovascular system: S1 & S2 heard, RRR. No JVD, murmurs, rubs, gallops or clicks. No pedal edema. Gastrointestinal system: Abdomen is nondistended, soft and nontender.  Central nervous system: Alert and oriented. No focal neurological deficits. Extremities: Symmetric 5 x 5 power. Skin: No rashes, lesions or ulcers Psychiatry: Judgement and insight appear normal. Mood & affect appropriate.     Data Reviewed: I have personally reviewed following labs and imaging studies  CBC:  Recent Labs Lab 05/06/16 2239 05/08/16 [id number]  WBC 8.7 5.9  NEUTROABS 7.0  --   HGB 13.1 12.0  HCT 39.3 36.4  MCV 89.7 90.8  PLT 304 288   Basic Metabolic Panel:  Recent Labs Lab 05/06/16 2239 05/07/16 1359 05/08/16 0823  NA 138  --  141  K 3.9  --  3.7  CL 102  --  107  CO2 24  --  29  GLUCOSE 182*  --  154*  BUN 21*  --  7  CREATININE 0.78  --  0.73  CALCIUM 9.1  --  8.4*  MG  --  1.9  --    GFR: Estimated Creatinine Clearance: 69.6 mL/min (by C-G formula based on SCr of 0.73 mg/dL). Liver Function Tests:  Recent Labs Lab 05/06/16 2239  AST 24  ALT 32  ALKPHOS 81  BILITOT 1.0  PROT 7.9  ALBUMIN 4.7    Recent Labs Lab 05/06/16 2239  LIPASE 21   No results for input(s): AMMONIA in the  last 168 hours. Coagulation Profile: No results for input(s): INR, PROTIME in the last 168 hours. Cardiac Enzymes:  Recent Labs Lab 05/07/16 1703 05/08/16 0200 05/08/16 0823  TROPONINI <0.03 <0.03 <0.03   BNP (last 3 results) No results for input(s): PROBNP in the last 8760 hours. HbA1C:  Recent Labs  05/07/16 0808  HGBA1C 8.7*   CBG:  Recent Labs Lab 05/07/16 0812 05/07/16 1130 05/07/16 1649 05/07/16 2144 05/08/16 0710  GLUCAP 142* 147* 117* 100* 146*   Lipid Profile: No results for input(s): CHOL, HDL, LDLCALC, TRIG, CHOLHDL, LDLDIRECT in the last 72 hours. Thyroid Function Tests: No results for input(s): TSH, T4TOTAL, FREET4, T3FREE, THYROIDAB in the last 72 hours. Anemia Panel: No results for input(s): VITAMINB12, FOLATE, FERRITIN, TIBC, IRON, RETICCTPCT in the last 72 hours. Sepsis Labs: No results for input(s): PROCALCITON, LATICACIDVEN in the last 168 hours.  No results found for this or any previous visit (from the past 240 hour(s)).       Radiology Studies: Ct Abdomen Pelvis W Contrast  Result Date: 05/07/2016 CLINICAL DATA:  65 year old female with diffuse abdominal pain worse in the left upper quadrant. EXAM: CT ABDOMEN AND PELVIS WITH CONTRAST TECHNIQUE: Multidetector CT imaging of the abdomen and pelvis was performed using the standard protocol following bolus administration of intravenous contrast. CONTRAST:  100mL ISOVUE-300 IOPAMIDOL (ISOVUE-300) INJECTION 61% COMPARISON:  Abdominal ultrasound dated 08/24/2015 FINDINGS: Lower chest: The visualized lung bases are clear. Coronary vascular calcification involving the LAD. No intra-abdominal free air or free fluid. Hepatobiliary: Mild diffuse fatty infiltration of the liver. Of the liver is otherwise unremarkable. No intrahepatic biliary ductal dilatation. There is irregular and thickened appearance of the gallbladder fundus likely adenomyomatosis. No gallstones. Pancreas: Unremarkable. No pancreatic  ductal dilatation or surrounding inflammatory changes. Spleen: There is a 3.0 x 3.3 cm area of non enhancement involving the inferior pole of the spleen compatible with a focal splenic infarct. No perisplenic fluid collection, abscess, or hematoma. Adrenals/Urinary Tract: The adrenal glands appear unremarkable. The kidneys, visualized ureters, and urinary bladder are grossly unremarkable. Stomach/Bowel: There is a small hiatal hernia with gastroesophageal reflux. Oral contrast opacifies the stomach and multiple loops of small bowel traverses into the colon. No evidence of bowel obstruction or active inflammation. There is sigmoid diverticulosis with muscular hypertrophy. No definite active inflammatory changes identified. Normal appendix. Vascular/Lymphatic: There is moderate aortoiliac atherosclerotic disease. The abdominal aorta and IVC are otherwise unremarkable. No portal venous gas identified. The splenic vein, SMV, and main portal vein are  patent. There is no adenopathy. Reproductive: The uterus and ovaries are grossly unremarkable. Other: None Musculoskeletal: No acute or significant osseous findings. 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 No fluid collection/ abscess or hematoma. Fatty liver. Sigmoid diverticulosis. No evidence of bowel obstruction or active inflammation. Normal appendix. Electronically Signed   By: Elgie Collard M.D.   On: 05/07/2016 02:16        Scheduled Meds: . [MAR Hold] amLODipine  2.5 mg Oral Daily  . [MAR Hold] atorvastatin  10 mg Oral q1800  . [MAR Hold] insulin aspart  0-5 Units Subcutaneous QHS  . [MAR Hold] insulin aspart  0-9 Units Subcutaneous TID WC  . [MAR Hold] mometasone-formoterol  2 puff Inhalation BID   Continuous Infusions: . sodium chloride 75 mL/hr at 05/08/16 0029  . heparin 1,100 Units/hr (05/08/16 0324)     LOS: 1 day    Time spent: 25    Rhetta Mura, MD [facility] Pager  [phone]  If 7PM-7AM, please contact night-coverage [email or web address] Password [email or web address] 05/08/2016, 11:00 AM

## 2016-05-08 NOTE — Interval H&P Note (Signed)
History and Physical Interval Note:  05/08/2016 1:55 PM  Sydney Herring  has presented today for surgery, with the diagnosis of splenic infarct  The various methods of treatment have been discussed with the patient and family. After consideration of risks, benefits and other options for treatment, the patient has consented to  Procedure(s): TRANSESOPHAGEAL ECHOCARDIOGRAM (TEE) (N/A) as a surgical intervention .  The patient's history has been reviewed, patient examined, no change in status, stable for surgery.  I have reviewed the patient's chart and labs.  Questions were answered to the patient's satisfaction.     Hassel Uphoff S

## 2016-05-09 ENCOUNTER — Inpatient Hospital Stay (HOSPITAL_COMMUNITY): Payer: Managed Care, Other (non HMO)

## 2016-05-09 LAB — CBC
HEMATOCRIT: 36.1 % (ref 36.0–46.0)
Hemoglobin: 11.9 g/dL — ABNORMAL LOW (ref 12.0–15.0)
MCH: 29.6 pg (ref 26.0–34.0)
MCHC: 33 g/dL (ref 30.0–36.0)
MCV: 89.8 fL (ref 78.0–100.0)
Platelets: 280 10*3/uL (ref 150–400)
RBC: 4.02 MIL/uL (ref 3.87–5.11)
RDW: 14.1 % (ref 11.5–15.5)
WBC: 6.2 10*3/uL (ref 4.0–10.5)

## 2016-05-09 LAB — CARDIOLIPIN ANTIBODIES, IGG, IGM, IGA

## 2016-05-09 LAB — BETA-2-GLYCOPROTEIN I ABS, IGG/M/A
Beta-2-Glycoprotein I IgA: <9 GPI IgA units (ref 0–25)
Beta-2-Glycoprotein I IgM: <9 GPI IgM units (ref 0–32)

## 2016-05-09 LAB — HEPARIN LEVEL (UNFRACTIONATED): Heparin Unfractionated: 0.7 IU/mL (ref 0.30–0.70)

## 2016-05-09 LAB — GLUCOSE, CAPILLARY
GLUCOSE-CAPILLARY: 152 mg/dL — AB (ref 65–99)
Glucose-Capillary: 142 mg/dL — ABNORMAL HIGH (ref 65–99)
Glucose-Capillary: 193 mg/dL — ABNORMAL HIGH (ref 65–99)

## 2016-05-09 LAB — PROTEIN C, TOTAL: Protein C, Total: 111 % (ref 60–150)

## 2016-05-09 MED ORDER — IOPAMIDOL (ISOVUE-370) INJECTION 76%
INTRAVENOUS | Status: AC
  Filled 2016-05-09: qty 100

## 2016-05-09 MED ORDER — INSULIN GLARGINE 100 UNIT/ML ~~LOC~~ SOLN
5.0000 [IU] | Freq: Every day | SUBCUTANEOUS | Status: DC
  Filled 2016-05-09: qty 0.05

## 2016-05-09 MED ORDER — IOPAMIDOL (ISOVUE-370) INJECTION 76%
INTRAVENOUS | Status: AC
  Administered 2016-05-09: 100 mL
  Filled 2016-05-09: qty 100

## 2016-05-09 NOTE — Progress Notes (Signed)
ANTICOAGULATION CONSULT NOTE - Follow Up Consult  Pharmacy Consult for Heparin Indication: Splenic infarct with prior hx VTE  No Known Allergies  Patient Measurements: Height: 5\' 3"  (160 cm) Weight: 169 lb 3.2 oz (76.7 kg) IBW/kg (Calculated) : 52.4 Heparin Dosing Weight:   Vital Signs: Temp: 98.4 F (36.9 C) (12/21 0545) Temp Source: Oral (12/20 2217) BP: 106/65 (12/21 0545) Pulse Rate: 72 (12/21 0545)  Labs:  Recent Labs  05/06/16 2239 05/07/16 1359 05/07/16 1703 05/08/16 0200 05/08/16 0220 05/08/16 0823 05/09/16 0543  HGB 13.1  --   --   --   --  12.0 11.9*  HCT 39.3  --   --   --   --  36.4 36.1  PLT 304  --   --   --   --  288 280  HEPARINUNFRC  --  0.43  --   --  0.56  --  0.70  CREATININE 0.78  --   --   --   --  0.73  --   TROPONINI  --   --  <0.03 <0.03  --  <0.03  --     Estimated Creatinine Clearance: 69.6 mL/min (by C-G formula based on SCr of 0.73 mg/dL).   Medications:  Heparin @ 1050 units/hr (10.5 ml/hr)  Assessment: [name] who c/o abdominal pain and vomiting, CT reveals splenic infarct, has h/o VTE but no longer on anticoag, suspected thrombotic etiology.Getting work-up completed. Pharmacy consulted to dose Heparin for anticoagulation.  Heparin level this morning remains therapeutic but trending up and on the higher end of the range (HL 0.7 << 0.56, goal of 0.3-0.7). CBC stable - no overt s/sx of bleeding noted at this time.   Goal of Therapy:  Heparin level 0.3-0.7 units/ml Monitor platelets by anticoagulation protocol: Yes   Plan:  1. Reduce Heparin to 1050 units/hr (10.5 ml/hr) 2. Will continue to monitor for any signs/symptoms of bleeding and will follow up with heparin level in the a.m.   Thank you for allowing pharmacy to be a part of this patient's care.  Georgina PillionElizabeth Khyle Goodell, PharmD, BCPS Clinical Pharmacist Pager: [phone] Clinical phone for 05/09/2016 from 7a-3:30p: [phone] If after 3:30p, please call main pharmacy at:  [phone] 05/09/2016 9:11 AM

## 2016-05-09 NOTE — Discharge Summary (Signed)
Physician Discharge Summary  Lafe GarinDiana Flessner ZOX:[id number] DOB: Sep 24, 1950 DOA: 05/06/2016  PCP: Lilia ArgueKAPLAN,KRISTEN, PA-C  Admit date: 05/06/2016 Discharge date: 05/09/2016  Time spent: 40 minutes  Recommendations for Outpatient Follow-up:  1. Follow up labs prothrombin/factor V/Hemoglobinopathy panel/Smear are pending 2. 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)   Diabetes mellitus type 2 in obese (HCC)   COPD (chronic obstructive pulmonary disease) (HCC)   HLD (hyperlipidemia)   Tobacco abuse, in remission   HTN (hypertension)   Diabetes mellitus with complication [facility](HCC)   Discharge Condition: fair  Diet recommendation:  hh dm  Filed Weights   05/06/16 2005 05/07/16 0519  Weight: 71.7 kg (158 lb) 76.7 kg (169 lb 3.2 oz)    History of present illness:  65 year old female Known DVT about 2007 Type 2 diabetes mellitus on metformin HTN COPD with continued smoking history Hyperlipidemia Reflux Vitamin D deficiency   She has been off anticoagulation about 6 years and has had 8 months of on and off pain and presented to the emergency room where she was found to have a splenic infarct   Vascular consulted for opinion    Hospital Course: Splenic infarct appreciate input from Dr. fields Dr. fields is of the opinion that hypercoagulable panel and other workup may not be indicated in this case and is recommending a CT of the chest -would broaden search for malignancy, autoimmune, Hemoglobinopathy if Ct scan or TEE not showing any cause TEE negative for PFO Cardiolipin//Lupus Ac/Protein c+s/anti-thrombin iii/homocysteine are neg prothrombin/factor V/Hemoglobinopathy/Smear are pending CT scan needs to be done once contrast passes  Continue IV heparin as long as no overt bleeding-- Dr. 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 No significant atherosclerotic calcifications in the region. 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 Imogene Burnhen [on call vascular] and feels this is a doubtable diagnosis and extremely hard to prove and frequently overdiagnosed---feels safe to dc/ home   Chronic DVT Stable   Diabetes mellitus type 2 HbA1c this admission. Sugars ranging between 140 and 190 On metoformin 750 XR at home cont SSI Added on 12/21 lantus 5 but this was held on d/c   HTn Cont Norvasc 2.5   COPD/former smoker Cont Dulera 2 puff bid   HLd     Consultations:  Vascular surgery  Discharge Exam: Vitals:   05/09/16 0545 05/09/16 1342  BP: 106/65 (!) 145/77  Pulse: 72 80  Resp: 17 17  Temp: 98.4 F (36.9 C) 98.4 F (36.9 C)    General: eomi ncat  Cardiovascular: s1 s2 no m/r/g Respiratory: clear no added sound abd soft nt nd no rebound  Discharge Instructions   Discharge Instructions    Diet - low sodium heart healthy    Complete by:  As directed    Discharge instructions    Complete by:  As directed    You probably do not need any anticoagulation, but will need a follow-up of all of your lab-work done in Hospital Please continue all of your other meds   Increase activity slowly    Complete by:  As directed      Current Discharge Medication List  CONTINUE these medications which have NOT CHANGED   Details  albuterol (PROVENTIL HFA) 108 (90 Base) MCG/ACT inhaler Inhale 2 puffs into the lungs every 4 (four) hours as needed for wheezing or shortness of breath.    amLODipine (NORVASC) 2.5 MG tablet Take 2.5 mg by mouth daily.    atorvastatin (LIPITOR) 10 MG tablet Take 10 mg by mouth daily.    Fluticasone-Salmeterol (ADVAIR) 250-50 MCG/DOSE AEPB Inhale 1 puff into the lungs 2 (two) times  daily as needed (for shortness of breath).     lansoprazole (PREVACID) 15 MG capsule Take 15 mg by mouth daily.    metFORMIN (GLUCOPHAGE-XR) 750 MG 24 hr tablet Take 750 mg by mouth daily with breakfast.       No Known Allergies    The results of significant diagnostics from this hospitalization (including imaging, microbiology, ancillary and laboratory) are listed below for reference.    Significant Diagnostic Studies: Dg Abd 1 View  Result Date: 05/09/2016 CLINICAL DATA:  Contrast reaction of premedication. Nausea, vomiting EXAM: ABDOMEN - 1 VIEW COMPARISON:  None. FINDINGS: There is oral contrast material within the distal ascending colon, transverse colon and to a lesser extent proximal descending colon. There is no bowel dilatation to suggest obstruction. There is no evidence of pneumoperitoneum, portal venous gas or pneumatosis. There are no pathologic calcifications along the expected course of the ureters. The osseous structures are unremarkable. IMPRESSION: There is oral contrast material within the distal ascending colon, transverse colon and to a lesser extent proximal descending colon. Electronically Signed   By: Elige Ko   On: 05/09/2016 12:08   Ct Abdomen Pelvis W Contrast  Result Date: 05/07/2016 CLINICAL DATA:  65 year old female with diffuse abdominal pain worse in the left upper quadrant. EXAM: CT ABDOMEN AND PELVIS WITH CONTRAST TECHNIQUE: Multidetector CT imaging of the abdomen and pelvis was performed using the standard protocol following bolus administration of intravenous contrast. CONTRAST:  ISOVUE-300 IOPAMIDOL (ISOVUE-300) INJECTION 61% COMPARISON:  Abdominal ultrasound dated 08/24/2015 FINDINGS: Lower chest: The visualized lung bases are clear. Coronary vascular calcification involving the LAD. No intra-abdominal free air or free fluid. Hepatobiliary: Mild diffuse fatty infiltration of the liver. Of the liver is otherwise unremarkable. No intrahepatic  biliary ductal dilatation. There is irregular and thickened appearance of the gallbladder fundus likely adenomyomatosis. No gallstones. Pancreas: Unremarkable. No pancreatic ductal dilatation or surrounding inflammatory changes. Spleen: There is a 3.0 x 3.3 cm area of non enhancement involving the inferior pole of the spleen compatible with a focal splenic infarct. No perisplenic fluid collection, abscess, or hematoma. Adrenals/Urinary Tract: The adrenal glands appear unremarkable. The kidneys, visualized ureters, and urinary bladder are grossly unremarkable. Stomach/Bowel: There is a small hiatal hernia with gastroesophageal reflux. Oral contrast opacifies the stomach and multiple loops of small bowel traverses into the colon. No evidence of bowel obstruction or active inflammation. There is sigmoid diverticulosis with muscular hypertrophy. No definite active inflammatory changes identified. Normal appendix. Vascular/Lymphatic: There is moderate aortoiliac atherosclerotic disease. The abdominal aorta and IVC are otherwise unremarkable. No portal venous gas identified. The splenic vein, SMV, and main portal vein are patent. There is no adenopathy. Reproductive: The uterus and ovaries are grossly unremarkable. Other: None Musculoskeletal: No acute or significant osseous findings. 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 No fluid collection/ abscess or hematoma. Fatty liver. Sigmoid diverticulosis. No evidence of bowel obstruction or active inflammation. Normal appendix. Electronically Signed   By: Ceasar Mons.D.  On: 05/07/2016 02:16    Microbiology: Recent Results (from the past 240 hour(s))  Culture, blood (Routine X 2) w Reflex to ID Panel     Status: None (Preliminary result)   Collection Time: 05/07/16  3:20 AM  Result Value Ref Range Status   Specimen Description BLOOD RIGHT ARM  Final   Special Requests BOTTLES DRAWN AEROBIC AND ANAEROBIC 5CC EACH  Final    Culture   Final    NO GROWTH 2 DAYS Performed at [facility]    Report Status PENDING  Incomplete  Culture, blood (Routine X 2) w Reflex to ID Panel     Status: None (Preliminary result)   Collection Time: 05/07/16  3:25 AM  Result Value Ref Range Status   Specimen Description BLOOD LEFT ARM  Final   Special Requests BOTTLES DRAWN AEROBIC AND ANAEROBIC 5CC EACH  Final   Culture   Final    NO GROWTH 2 DAYS Performed at [facility]    Report Status PENDING  Incomplete     Labs: Basic Metabolic Panel:  Recent Labs Lab 05/06/16 2239 05/07/16 1359 05/08/16 0823  NA 138  --  141  K 3.9  --  3.7  CL 102  --  107  CO2 24  --  29  GLUCOSE 182*  --  154*  BUN 21*  --  7  CREATININE 0.78  --  0.73  CALCIUM 9.1  --  8.4*  MG  --  1.9  --    Liver Function Tests:  Recent Labs Lab 05/06/16 2239  AST 24  ALT 32  ALKPHOS 81  BILITOT 1.0  PROT 7.9  ALBUMIN 4.7    Recent Labs Lab 05/06/16 2239  LIPASE 21   No results for input(s): AMMONIA in the last 168 hours. CBC:  Recent Labs Lab 05/06/16 2239 05/08/16 0823 05/09/16 0543  WBC 8.7 5.9 6.2  NEUTROABS 7.0  --   --   HGB 13.1 12.0 11.9*  HCT 39.3 36.4 36.1  MCV 89.7 90.8 89.8  PLT 304 288 280   Cardiac Enzymes:  Recent Labs Lab 05/07/16 1703 05/08/16 0200 05/08/16 0823  TROPONINI <0.03 <0.03 <0.03   BNP: BNP (last 3 results) No results for input(s): BNP in the last 8760 hours.  ProBNP (last 3 results) No results for input(s): PROBNP in the last 8760 hours.  CBG:  Recent Labs Lab 05/08/16 1128 05/08/16 1726 05/08/16 2154 05/09/16 0726 05/09/16 1110  GLUCAP 114* 91 119* 142* 193*       Signed:  Rhetta MuraSAMTANI, JAI-GURMUKH MD   [facility] 05/09/2016, 5:55 PM

## 2016-05-09 NOTE — Progress Notes (Signed)
[name] to be D/C'd  per MD order. Discussed with the patient and all questions fully answered.  VSS, Skin clean, dry and intact without evidence of skin break down, no evidence of skin tears noted.  IV catheter discontinued intact. Site without signs and symptoms of complications. Dressing and pressure applied.  An After Visit Summary was printed and given to the patient. Patient received prescription.  D/c education completed with patient/family including follow up instructions, medication list, d/c activities limitations if indicated, with other d/c instructions as indicated by MD - patient able to verbalize understanding, all questions fully answered.   Patient instructed to return to ED, call 911, or call MD for any changes in condition.   Patient to be escorted via WC, and D/C home via private auto.

## 2016-05-09 NOTE — Progress Notes (Signed)
ABD x-ray showed... There is oral contrast material within the distal ascending colon, transverse colon and to a lesser extent proximal descending colon. Waiting for no contrast in bowels to do CT scan.

## 2016-05-10 LAB — HEMOGLOBINOPATHY EVALUATION
HGB A2 QUANT: 2.1 % (ref 1.8–3.2)
HGB A: 97.9 % (ref 96.4–98.8)
HGB C: 0 %
HGB S QUANTITAION: 0 %
Hgb F Quant: 0 % (ref 0.0–2.0)
Hgb Variant: 0 %

## 2016-05-10 LAB — PROTHROMBIN GENE MUTATION

## 2016-05-11 LAB — BLOOD CULTURE ID PANEL (REFLEXED)
Acinetobacter baumannii: NOT DETECTED
CANDIDA GLABRATA: NOT DETECTED
CANDIDA KRUSEI: NOT DETECTED
CANDIDA PARAPSILOSIS: NOT DETECTED
CANDIDA TROPICALIS: NOT DETECTED
Candida albicans: NOT DETECTED
ENTEROBACTER CLOACAE COMPLEX: NOT DETECTED
ESCHERICHIA COLI: NOT DETECTED
Enterobacteriaceae species: NOT DETECTED
Enterococcus species: NOT DETECTED
Haemophilus influenzae: NOT DETECTED
KLEBSIELLA PNEUMONIAE: NOT DETECTED
Klebsiella oxytoca: NOT DETECTED
Listeria monocytogenes: NOT DETECTED
Neisseria meningitidis: NOT DETECTED
PROTEUS SPECIES: NOT DETECTED
PSEUDOMONAS AERUGINOSA: NOT DETECTED
SERRATIA MARCESCENS: NOT DETECTED
STAPHYLOCOCCUS AUREUS BCID: NOT DETECTED
STAPHYLOCOCCUS SPECIES: NOT DETECTED
STREPTOCOCCUS PNEUMONIAE: NOT DETECTED
Streptococcus agalactiae: NOT DETECTED
Streptococcus pyogenes: NOT DETECTED
Streptococcus species: NOT DETECTED

## 2016-05-12 LAB — CULTURE, BLOOD (ROUTINE X 2): Culture: NO GROWTH

## 2016-05-14 LAB — FACTOR 5 LEIDEN

## 2016-05-16 ENCOUNTER — Encounter: Payer: Self-pay | Admitting: Oncology

## 2016-05-16 ENCOUNTER — Telehealth: Payer: Self-pay | Admitting: Oncology

## 2016-05-16 NOTE — Telephone Encounter (Signed)
Appt scheduled w/Shadad on 1/26 at 11am. Pt aware to arrive 30 minutes early. Demographics verified. Pt's [employer] insurance will change in Jan. Advised to bring in new insurance at the appt. Voiced understanding. Letter and directions mailed.

## 2016-06-14 ENCOUNTER — Ambulatory Visit (HOSPITAL_BASED_OUTPATIENT_CLINIC_OR_DEPARTMENT_OTHER): Payer: Commercial Managed Care - PPO | Admitting: Oncology

## 2016-06-14 ENCOUNTER — Telehealth: Payer: Self-pay | Admitting: Oncology

## 2016-06-14 VITALS — BP 145/97 | HR 52 | Temp 98.2°F | Resp 18 | Ht 63.0 in | Wt 161.2 lb

## 2016-06-14 DIAGNOSIS — I10 Essential (primary) hypertension: Secondary | ICD-10-CM

## 2016-06-14 DIAGNOSIS — E119 Type 2 diabetes mellitus without complications: Secondary | ICD-10-CM | POA: Diagnosis not present

## 2016-06-14 DIAGNOSIS — I82532 Chronic embolism and thrombosis of left popliteal vein: Secondary | ICD-10-CM

## 2016-06-14 DIAGNOSIS — J449 Chronic obstructive pulmonary disease, unspecified: Secondary | ICD-10-CM | POA: Diagnosis not present

## 2016-06-14 NOTE — Telephone Encounter (Signed)
Appointments scheduled per 06/14/16 los. Patient was given a copy of the AVS report and appointment schedule per 06/14/16 los.  ° °

## 2016-06-14 NOTE — Progress Notes (Signed)
Reason for Referral: Splenic infarct.   HPI: 66 year old woman native of [place] where she lived the majority of her life. She has been living in this area for the last 8 years. She was in her usual state of health until she developed a left lower extremity deep vein thrombosis over 10 years ago. She vaguely remembers the details of that but she was around 82 to 66 years of age while she was working in the Geophysicist/field seismologist on [employer]. She recalls having to work and tight spaces plan extended period of time. She denied any trauma specifically to her left lower extremity. She developed swelling in the leg acutely and was diagnosed with left lower extremity deep vein thrombosis. She was treated with intravenous heparin and was switched to Coumadin and she took it for about 5 years. She has been off of it for the last 5-6 years to she started developing left upper quadrant pain. Her evaluation was unrevealing including ultrasound of the abdomen. She was subsequently hospitalized on 05/07/2016 because of increased left-sided flank pain. CT scan of the abdomen and pelvis obtained on 05/07/2016 showed focal splenic infarct involving the inferior pole of the spleen. No fluid collection, abscess or hematoma. Sigmoid diverticulosis was noted. Otherwise normal findings.  During her hospitalization, she had an extensive workup including a hypercoagulable panel that was normal. She also had a vascular workup including TEE which did not show a PFO, hemoglobinopathy screen which was negative with normal hemoglobin electrophoresis. CT angiogram of the chest was also noted for no aortic dissection and moderate to marked focal o'clock narrowing of the proximal celiac artery. No significant atherosclerosis was noted. Patient was discharged without anticoagulation. She was prescribed Xarelto by her primary care provider which she has been on for the last month.  Clinically she feels reasonably fair at this time  and does report periodic left upper quadrant pain. She is able to drive and attend to her activities of daily living. She has not reported any constitutional symptoms of fevers or chills or sweats. Does not report any weight loss. She has not reported any lymphadenopathy or petechiae. She denied any family history or other personal history of thrombosis. She denied any pregnancy complications. Although she has not been pregnant in the past.  She is not report any headaches, blurry vision, syncope or seizures. She does not report any fevers, chills or sweats. She does not report any cough, wheezing or hemoptysis. She is not reporting nausea, vomiting. She does not report any constipation, diarrhea. She does not report any frequency urgency or hesitancy. She does not report any skeletal complaints. Remaining review of systems unremarkable.   Past Medical History:  Diagnosis Date  . COPD (chronic obstructive pulmonary disease) ([place])   . Diabetes mellitus without complication ([place])   . DVT (deep venous thrombosis) ([place])   . Gastroesophageal reflux   . Hyperlipidemia   . Hypertension   . Peripheral vascular disease ([place])   . 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 Vitamin D deficiency   :  Past Surgical History:  Procedure Laterality Date  . ELBOW SURGERY    . EYE SURGERY    . TEE WITHOUT CARDIOVERSION N/A 05/08/2016   Procedure: TRANSESOPHAGEAL ECHOCARDIOGRAM (TEE);  Surgeon: Dixie Dials, MD;  Location: [facility];  Service: Cardiovascular;  Laterality: N/A;  . WISDOM TOOTH EXTRACTION    :   Current Outpatient Prescriptions:  .  albuterol (PROVENTIL HFA) 108 (90 Base) MCG/ACT inhaler, Inhale 2 puffs into the lungs  every 4 (four) hours as needed for wheezing or shortness of breath., Disp: , Rfl:  .  amLODipine (NORVASC) 2.5 MG tablet, Take 2.5 mg by mouth daily., Disp: , Rfl:  .  atorvastatin (LIPITOR) 10 MG tablet, Take 10 mg by mouth daily., Disp: , Rfl:  .  Fluticasone-Salmeterol (ADVAIR) 250-50  MCG/DOSE AEPB, Inhale 1 puff into the lungs 2 (two) times daily as needed (for shortness of breath). , Disp: , Rfl:  .  lansoprazole (PREVACID) 15 MG capsule, Take 15 mg by mouth daily., Disp: , Rfl:  .  metFORMIN (GLUCOPHAGE-XR) 750 MG 24 hr tablet, Take 750 mg by mouth daily with breakfast., Disp: , Rfl:  .  rivaroxaban (XARELTO) 20 MG TABS tablet, Take 20 mg by mouth daily., Disp: , Rfl: :  No Known Allergies:  No family history on file.:  Social History   Social History  . Marital status: Married    Spouse name: N/A  . Number of children: N/A  . Years of education: N/A   Occupational History  . Not on file.   Social History Main Topics  . Smoking status: Former Smoker    Quit date: 08/19/2015  . Smokeless tobacco: Never Used  . Alcohol use 0.0 oz/week     Comment: social  . Drug use: No  . Sexual activity: Not on file   Other Topics Concern  . Not on file   Social History Narrative  . No narrative on file  :  Pertinent items are noted in HPI.  Exam: Blood pressure (!) 145/97, pulse (!) 52, temperature 98.2 F (36.8 C), temperature source Oral, resp. rate 18, height 5' 3"  (1.6 m), weight 161 lb 3.2 oz (73.1 kg), SpO2 99 %. General appearance: alert and cooperative Head: Normocephalic, without obvious abnormality Throat: lips, mucosa, and tongue normal; teeth and gums normal Neck: no adenopathy Back: negative Resp: clear to auscultation bilaterally Cardio: regular rate and rhythm, S1, S2 normal, no murmur, click, rub or gallop GI: soft, non-tender; bowel sounds normal; no masses,  no organomegaly Extremities: extremities normal, atraumatic, no cyanosis or edema Skin: Skin color, texture, turgor normal. No rashes or lesions Lymph nodes: Cervical, supraclavicular, and axillary nodes normal.  CBC    Component Value Date/Time   WBC 6.2 05/09/2016 0543   RBC 4.02 05/09/2016 0543   HGB 11.9 (L) 05/09/2016 0543   HCT 36.1 05/09/2016 0543   PLT 280 05/09/2016 0543    MCV 89.8 05/09/2016 0543   MCH 29.6 05/09/2016 0543   MCHC 33.0 05/09/2016 0543   RDW 14.1 05/09/2016 0543   LYMPHSABS 1.3 05/06/2016 2239   MONOABS 0.3 05/06/2016 2239   EOSABS 0.0 05/06/2016 2239   BASOSABS 0.0 05/06/2016 2239     Chemistry      Component Value Date/Time   NA 141 05/08/2016 0823   K 3.7 05/08/2016 0823   CL 107 05/08/2016 0823   CO2 29 05/08/2016 0823   BUN 7 05/08/2016 0823   CREATININE 0.73 05/08/2016 0823      Component Value Date/Time   CALCIUM 8.4 (L) 05/08/2016 0823   ALKPHOS 81 05/06/2016 2239   AST 24 05/06/2016 2239   ALT 32 05/06/2016 2239   BILITOT 1.0 05/06/2016 2239       Assessment and Plan:  66 year old woman with the following issues:  1. Splenic infarct noted in December 2017 on a CT scan of the abdomen and pelvis. She presented with left upper quadrant abdominal pain in the last year. Her  hypercoagulable workup did not reveal any abnormalities. Her hemoglobinopathy screen has also been normal. Her imaging studies did not show any evidence of malignancy. Her vascular workup did not show any clear-cut abnormalities.  The differential diagnosis was reviewed today with the patient. Causes of splenic infarct include myeloproliferative disorder, autoimmune disorder, hemoglobinopathy, vascular abnormalities among others. Her workup has been rather extensive and really unrevealing for any clear-cut etiology.  I see no clear-cut indication for long-term anticoagulation but certainly her history is very worrisome for now 2 unprovoked thrombosis episodes. Risks and benefits of continuing Xarelto was reviewed today. These risks will include GI and CNS bleeding that could be catastrophic she develops any trauma. The risks of being off anticoagulation would be further thrombosis episodes.  Rest of discussion today, she favors staying on Xarelto for the time being. It would be reasonable to consider discontinuation after 6 months. I will evaluate her in  6 months and determined her risks and benefits at that time.  2. Evaluation for myeloproliferative disorder: She has no signs or symptoms to suggest with a normal CBC. Bone marrow biopsy would be indicated if any abnormalities are noted on his CBC in the future.  3. Follow-up: Will be in 6 months sooner if needed to.

## 2016-07-17 ENCOUNTER — Other Ambulatory Visit: Payer: Self-pay | Admitting: Family Medicine

## 2016-07-17 DIAGNOSIS — R1012 Left upper quadrant pain: Secondary | ICD-10-CM

## 2016-07-18 DIAGNOSIS — I639 Cerebral infarction, unspecified: Secondary | ICD-10-CM

## 2016-07-18 HISTORY — DX: Cerebral infarction, unspecified: I63.9

## 2016-07-18 HISTORY — PX: TRANSTHORACIC ECHOCARDIOGRAM: SHX275

## 2016-07-19 ENCOUNTER — Ambulatory Visit
Admission: RE | Admit: 2016-07-19 | Discharge: 2016-07-19 | Disposition: A | Discharge status: Home / Self Care | Payer: Commercial Managed Care - PPO | Source: Ambulatory Visit | Attending: Family Medicine | Admitting: Family Medicine

## 2016-07-19 ENCOUNTER — Other Ambulatory Visit: Payer: Commercial Managed Care - PPO

## 2016-07-19 DIAGNOSIS — R1012 Left upper quadrant pain: Secondary | ICD-10-CM

## 2016-07-19 MED ORDER — IOPAMIDOL (ISOVUE-300) INJECTION 61%
100.0000 mL | Freq: Once | INTRAVENOUS | Status: AC | PRN
  Administered 2016-07-19: 100 mL via INTRAVENOUS

## 2016-08-01 ENCOUNTER — Encounter (HOSPITAL_BASED_OUTPATIENT_CLINIC_OR_DEPARTMENT_OTHER): Payer: Self-pay

## 2016-08-01 ENCOUNTER — Observation Stay (HOSPITAL_COMMUNITY): Payer: Commercial Managed Care - PPO

## 2016-08-01 ENCOUNTER — Emergency Department (HOSPITAL_BASED_OUTPATIENT_CLINIC_OR_DEPARTMENT_OTHER): Payer: Commercial Managed Care - PPO

## 2016-08-01 ENCOUNTER — Inpatient Hospital Stay (HOSPITAL_BASED_OUTPATIENT_CLINIC_OR_DEPARTMENT_OTHER)
Admission: EM | Admit: 2016-08-01 | Discharge: 2016-08-04 | DRG: 065 | Disposition: A | Discharge status: Home / Self Care | Payer: Commercial Managed Care - PPO | Attending: Family Medicine | Admitting: Family Medicine

## 2016-08-01 DIAGNOSIS — E118 Type 2 diabetes mellitus with unspecified complications: Secondary | ICD-10-CM | POA: Diagnosis not present

## 2016-08-01 DIAGNOSIS — I10 Essential (primary) hypertension: Secondary | ICD-10-CM | POA: Diagnosis not present

## 2016-08-01 DIAGNOSIS — I493 Ventricular premature depolarization: Secondary | ICD-10-CM | POA: Diagnosis not present

## 2016-08-01 DIAGNOSIS — G459 Transient cerebral ischemic attack, unspecified: Secondary | ICD-10-CM | POA: Diagnosis not present

## 2016-08-01 DIAGNOSIS — E785 Hyperlipidemia, unspecified: Secondary | ICD-10-CM | POA: Diagnosis present

## 2016-08-01 DIAGNOSIS — Z8673 Personal history of transient ischemic attack (TIA), and cerebral infarction without residual deficits: Secondary | ICD-10-CM

## 2016-08-01 DIAGNOSIS — Z7901 Long term (current) use of anticoagulants: Secondary | ICD-10-CM

## 2016-08-01 DIAGNOSIS — J410 Simple chronic bronchitis: Secondary | ICD-10-CM | POA: Diagnosis not present

## 2016-08-01 DIAGNOSIS — Z7951 Long term (current) use of inhaled steroids: Secondary | ICD-10-CM

## 2016-08-01 DIAGNOSIS — I87009 Postthrombotic syndrome without complications of unspecified extremity: Secondary | ICD-10-CM | POA: Diagnosis present

## 2016-08-01 DIAGNOSIS — G8194 Hemiplegia, unspecified affecting left nondominant side: Secondary | ICD-10-CM | POA: Diagnosis present

## 2016-08-01 DIAGNOSIS — Z87891 Personal history of nicotine dependence: Secondary | ICD-10-CM

## 2016-08-01 DIAGNOSIS — R269 Unspecified abnormalities of gait and mobility: Secondary | ICD-10-CM | POA: Diagnosis present

## 2016-08-01 DIAGNOSIS — J449 Chronic obstructive pulmonary disease, unspecified: Secondary | ICD-10-CM | POA: Diagnosis present

## 2016-08-01 DIAGNOSIS — E860 Dehydration: Secondary | ICD-10-CM | POA: Diagnosis not present

## 2016-08-01 DIAGNOSIS — E784 Other hyperlipidemia: Secondary | ICD-10-CM

## 2016-08-01 DIAGNOSIS — R42 Dizziness and giddiness: Secondary | ICD-10-CM

## 2016-08-01 DIAGNOSIS — R531 Weakness: Secondary | ICD-10-CM

## 2016-08-01 DIAGNOSIS — K219 Gastro-esophageal reflux disease without esophagitis: Secondary | ICD-10-CM | POA: Diagnosis present

## 2016-08-01 DIAGNOSIS — I82532 Chronic embolism and thrombosis of left popliteal vein: Secondary | ICD-10-CM | POA: Diagnosis present

## 2016-08-01 DIAGNOSIS — R29702 NIHSS score 2: Secondary | ICD-10-CM | POA: Diagnosis present

## 2016-08-01 DIAGNOSIS — I639 Cerebral infarction, unspecified: Principal | ICD-10-CM | POA: Diagnosis present

## 2016-08-01 DIAGNOSIS — Z79899 Other long term (current) drug therapy: Secondary | ICD-10-CM

## 2016-08-01 DIAGNOSIS — Z6827 Body mass index (BMI) 27.0-27.9, adult: Secondary | ICD-10-CM

## 2016-08-01 DIAGNOSIS — Z823 Family history of stroke: Secondary | ICD-10-CM

## 2016-08-01 DIAGNOSIS — Z7984 Long term (current) use of oral hypoglycemic drugs: Secondary | ICD-10-CM

## 2016-08-01 DIAGNOSIS — E663 Overweight: Secondary | ICD-10-CM | POA: Diagnosis present

## 2016-08-01 DIAGNOSIS — E1151 Type 2 diabetes mellitus with diabetic peripheral angiopathy without gangrene: Secondary | ICD-10-CM | POA: Diagnosis present

## 2016-08-01 LAB — URINALYSIS, ROUTINE W REFLEX MICROSCOPIC
BILIRUBIN URINE: NEGATIVE
KETONES UR: NEGATIVE mg/dL
LEUKOCYTES UA: NEGATIVE
NITRITE: NEGATIVE
PH: 5.5 (ref 5.0–8.0)
PROTEIN: NEGATIVE mg/dL
Specific Gravity, Urine: 1.007 (ref 1.005–1.030)

## 2016-08-01 LAB — RAPID URINE DRUG SCREEN, HOSP PERFORMED
AMPHETAMINES: NOT DETECTED
Barbiturates: NOT DETECTED
Benzodiazepines: NOT DETECTED
COCAINE: NOT DETECTED
OPIATES: NOT DETECTED
TETRAHYDROCANNABINOL: NOT DETECTED

## 2016-08-01 LAB — DIFFERENTIAL
BASOS ABS: 0 10*3/uL (ref 0.0–0.1)
BASOS PCT: 0 %
EOS ABS: 0 10*3/uL (ref 0.0–0.7)
Eosinophils Relative: 1 %
Lymphocytes Relative: 25 %
Lymphs Abs: 1.3 10*3/uL (ref 0.7–4.0)
MONOS PCT: 9 %
Monocytes Absolute: 0.4 10*3/uL (ref 0.1–1.0)
NEUTROS ABS: 3.4 10*3/uL (ref 1.7–7.7)
NEUTROS PCT: 65 %

## 2016-08-01 LAB — CBC
HEMATOCRIT: 40 % (ref 36.0–46.0)
HEMOGLOBIN: 13 g/dL (ref 12.0–15.0)
MCH: 29.4 pg (ref 26.0–34.0)
MCHC: 32.5 g/dL (ref 30.0–36.0)
MCV: 90.5 fL (ref 78.0–100.0)
Platelets: 300 10*3/uL (ref 150–400)
RBC: 4.42 MIL/uL (ref 3.87–5.11)
RDW: 14.2 % (ref 11.5–15.5)
WBC: 5.2 10*3/uL (ref 4.0–10.5)

## 2016-08-01 LAB — COMPREHENSIVE METABOLIC PANEL
ALBUMIN: 4.4 g/dL (ref 3.5–5.0)
ALT: 26 U/L (ref 14–54)
AST: 20 U/L (ref 15–41)
Alkaline Phosphatase: 86 U/L (ref 38–126)
Anion gap: 6 (ref 5–15)
BUN: 31 mg/dL — AB (ref 6–20)
CHLORIDE: 104 mmol/L (ref 101–111)
CO2: 27 mmol/L (ref 22–32)
CREATININE: 0.87 mg/dL (ref 0.44–1.00)
Calcium: 9 mg/dL (ref 8.9–10.3)
GFR calc Af Amer: >60 mL/min (ref >60)
GFR calc non Af Amer: >60 mL/min (ref >60)
Glucose, Bld: 285 mg/dL — ABNORMAL HIGH (ref 65–99)
Potassium: 4.1 mmol/L (ref 3.5–5.1)
Sodium: 137 mmol/L (ref 135–145)
Total Bilirubin: 0.9 mg/dL (ref 0.3–1.2)
Total Protein: 7.8 g/dL (ref 6.5–8.1)

## 2016-08-01 LAB — URINALYSIS, MICROSCOPIC (REFLEX): WBC, UA: NONE SEEN WBC/hpf (ref 0–5)

## 2016-08-01 LAB — GLUCOSE, CAPILLARY: Glucose-Capillary: 102 mg/dL — ABNORMAL HIGH (ref 65–99)

## 2016-08-01 LAB — MAGNESIUM: Magnesium: 2.2 mg/dL (ref 1.7–2.4)

## 2016-08-01 LAB — PROTIME-INR
INR: 2.18
Prothrombin Time: 24.6 seconds — ABNORMAL HIGH (ref 11.4–15.2)

## 2016-08-01 LAB — TSH: TSH: 1.326 u[IU]/mL (ref 0.350–4.500)

## 2016-08-01 LAB — ETHANOL: Alcohol, Ethyl (B): <5 mg/dL (ref <5)

## 2016-08-01 LAB — TROPONIN I: Troponin I: <0.03 ng/mL (ref <0.03)

## 2016-08-01 LAB — APTT: APTT: 69 s — AB (ref 24–36)

## 2016-08-01 LAB — CBG MONITORING, ED: Glucose-Capillary: 150 mg/dL — ABNORMAL HIGH (ref 65–99)

## 2016-08-01 MED ORDER — ACETAMINOPHEN 650 MG RE SUPP: 650.0000 mg | RECTAL | Status: DC | PRN

## 2016-08-01 MED ORDER — ACETAMINOPHEN 325 MG PO TABS: 650.0000 mg | ORAL_TABLET | ORAL | Status: DC | PRN

## 2016-08-01 MED ORDER — INSULIN ASPART 100 UNIT/ML ~~LOC~~ SOLN: 0.0000 [IU] | Freq: Every day | SUBCUTANEOUS | Status: DC

## 2016-08-01 MED ORDER — SENNOSIDES-DOCUSATE SODIUM 8.6-50 MG PO TABS: 1.0000 | ORAL_TABLET | Freq: Every evening | ORAL | Status: DC | PRN

## 2016-08-01 MED ORDER — ATORVASTATIN CALCIUM 10 MG PO TABS
10.0000 mg | ORAL_TABLET | Freq: Every day | ORAL | Status: DC
  Administered 2016-08-02 – 2016-08-04 (×3): 10 mg via ORAL
  Filled 2016-08-01 (×3): qty 1

## 2016-08-01 MED ORDER — AMLODIPINE BESYLATE 2.5 MG PO TABS
2.5000 mg | ORAL_TABLET | Freq: Every day | ORAL | Status: DC
  Administered 2016-08-02: 2.5 mg via ORAL
  Filled 2016-08-01: qty 1

## 2016-08-01 MED ORDER — ACETAMINOPHEN 160 MG/5ML PO SOLN: 650.0000 mg | ORAL | Status: DC | PRN

## 2016-08-01 MED ORDER — PANTOPRAZOLE SODIUM 20 MG PO TBEC
20.0000 mg | DELAYED_RELEASE_TABLET | Freq: Every day | ORAL | Status: DC
Start: 2016-08-02 — End: 2016-08-04
  Administered 2016-08-02 – 2016-08-04 (×3): 20 mg via ORAL
  Filled 2016-08-01 (×3): qty 1

## 2016-08-01 MED ORDER — RIVAROXABAN 20 MG PO TABS
20.0000 mg | ORAL_TABLET | Freq: Every day | ORAL | Status: DC
  Administered 2016-08-02 – 2016-08-03 (×2): 20 mg via ORAL
  Filled 2016-08-01 (×2): qty 1

## 2016-08-01 MED ORDER — MOMETASONE FURO-FORMOTEROL FUM 200-5 MCG/ACT IN AERO
2.0000 | INHALATION_SPRAY | Freq: Two times a day (BID) | RESPIRATORY_TRACT | Status: DC
  Administered 2016-08-02 – 2016-08-04 (×5): 2 via RESPIRATORY_TRACT
  Filled 2016-08-01: qty 8.8

## 2016-08-01 MED ORDER — INSULIN ASPART 100 UNIT/ML ~~LOC~~ SOLN
0.0000 [IU] | Freq: Three times a day (TID) | SUBCUTANEOUS | Status: DC
  Administered 2016-08-02: 100 [IU] via SUBCUTANEOUS
  Administered 2016-08-03 – 2016-08-04 (×3): 1 [IU] via SUBCUTANEOUS

## 2016-08-01 MED ORDER — SODIUM CHLORIDE 0.9 % IV SOLN
INTRAVENOUS | Status: DC
  Administered 2016-08-01 – 2016-08-03 (×3): 1000 mL via INTRAVENOUS

## 2016-08-01 MED ORDER — STROKE: EARLY STAGES OF RECOVERY BOOK
Freq: Once | Status: AC
  Administered 2016-08-01: 22:00:00

## 2016-08-01 NOTE — ED Provider Notes (Signed)
[facility] Provider Note   CSN: [id number] Arrival date & time: 08/01/16  1340     History   Chief Complaint Chief Complaint  Patient presents with  . Dizziness    HPI Sydney Herring is a 66 y.o. female.  Patient is a 66 year old female with a history diabetes, COPD, hyperlipidemia, splenic infarct and DVT on Xarelto who presents with dizziness and right-sided weakness. She states when she woke up this morning she noted that she was having dizziness and trouble walking. She feels a little bit off balance when she walks. She denies any headaches. She denies any spinning sensation. She has noticed that her right leg is weaker today and that she feels like she's been dragging it at times. She also notices that she has been dropping things with her right arm. She denies any numbness in her arm or leg. Denies any speech deficits or vision changes. Denies any prior history of stroke. She states she was normal when she went to bed last night and notices symptoms when she first woke up this morning.  LSN was 3am when she got up to go to the bathroom and she first noticed symptoms when she got up at 10:00am.      Past Medical History:  Diagnosis Date  . COPD (chronic obstructive pulmonary disease) (HCC)   . Diabetes mellitus without complication (HCC)   . DVT (deep venous thrombosis) (HCC)   . Gastroesophageal reflux   . Hyperlipidemia   . Hypertension   . Peripheral vascular disease (HCC)   . 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 Vitamin D deficiency     Patient Active Problem List   Diagnosis Date Noted  . 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 Diabetes mellitus type 2 in obese (HCC) 05/07/2016  . COPD (chronic obstructive pulmonary disease) (HCC) 05/07/2016  . HLD (hyperlipidemia) 05/07/2016  . 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 HTN (hypertension) 05/07/2016  . Diabetes mellitus with complication (HCC)   . 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 Chronic venous insufficiency 11/03/2015    Past Surgical History:  Procedure Laterality Date  . ELBOW SURGERY    . EYE SURGERY    . TEE WITHOUT CARDIOVERSION N/A 05/08/2016   Procedure: TRANSESOPHAGEAL ECHOCARDIOGRAM (TEE);  Surgeon: Orpah Cobb, MD;  Location: [facility] ENDOSCOPY;  Service: Cardiovascular;  Laterality: N/A;  . WISDOM TOOTH EXTRACTION      OB History    No data available       Home Medications    Prior to Admission medications   Medication Sig Start Date End Date Taking? Authorizing Provider  Probiotic Product (PROBIOTIC PO) Take by mouth.   Yes Historical Provider, MD  albuterol (PROVENTIL HFA) 108 (90 Base) MCG/ACT inhaler Inhale 2 puffs into the lungs every 4 (four) hours as needed for wheezing or shortness of breath.    Historical Provider, MD  amLODipine (NORVASC) 2.5 MG tablet Take 2.5 mg by mouth daily.    Historical Provider, MD  atorvastatin (LIPITOR) 10 MG tablet Take 10 mg by mouth daily.    Historical Provider, MD  Fluticasone-Salmeterol (ADVAIR) 250-50 MCG/DOSE AEPB Inhale 1 puff into the lungs 2 (two) times daily as needed (for shortness of breath).     Historical Provider, MD  lansoprazole (PREVACID) 15 MG capsule Take 15 mg by mouth daily. 04/21/16   Historical Provider, MD  metFORMIN (GLUCOPHAGE-XR) 750 MG 24 hr tablet Take 750 mg  by mouth daily with breakfast.    Historical Provider, MD  rivaroxaban (XARELTO) 20 MG TABS tablet Take 20 mg by mouth daily. 05/15/16   Historical Provider, MD    Family History No family history on file.  Social History Social History  Substance Use Topics  . Smoking status: Former Smoker    Quit date: 08/19/2015  . Smokeless tobacco: Never Used  . Alcohol use 0.0 oz/week     Comment: social     Allergies   Patient has no known allergies.   Review of Systems Review of Systems  Constitutional: Negative for chills, diaphoresis, fatigue and fever.   HENT: Negative for congestion, rhinorrhea and sneezing.   Eyes: Negative.   Respiratory: Negative for cough, chest tightness and shortness of breath.   Cardiovascular: Negative for chest pain and leg swelling.  Gastrointestinal: Negative for abdominal pain, blood in stool, diarrhea, nausea and vomiting.  Genitourinary: Negative for difficulty urinating, flank pain, frequency and hematuria.  Musculoskeletal: Negative for arthralgias and back pain.  Skin: Negative for rash.  Neurological: Positive for dizziness and weakness. Negative for speech difficulty, numbness and headaches.     Physical Exam Updated Vital Signs BP (!) 144/78   Pulse 82   Temp 97.4 F (36.3 C) (Oral)   Resp 18   Ht 5\' 3"  (1.6 m)   Wt 158 lb (71.7 kg)   SpO2 97%   BMI 27.99 kg/m   Physical Exam  Constitutional: She is oriented to person, place, and time. She appears well-developed and well-nourished.  HENT:  Head: Normocephalic and atraumatic.  Eyes: Pupils are equal, round, and reactive to light.  Neck: Normal range of motion. Neck supple.  Cardiovascular: Normal rate, regular rhythm and normal heart sounds.   Pulmonary/Chest: Effort normal and breath sounds normal. No respiratory distress. She has no wheezes. She has no rales. She exhibits no tenderness.  Abdominal: Soft. Bowel sounds are normal. There is no tenderness. There is no rebound and no guarding.  Musculoskeletal: Normal range of motion. She exhibits no edema.  Lymphadenopathy:    She has no cervical adenopathy.  Neurological: She is alert and oriented to person, place, and time.  Motor 5/5 all extremities, no drift Sensation grossly intact to LT all extremities Finger to Nose intact, no pronator drift CN II-XII grossly intact    Skin: Skin is warm and dry. No rash noted.  Psychiatric: She has a normal mood and affect.     ED Treatments / Results  Labs (all labs ordered are listed, but only abnormal results are displayed) Labs  Reviewed  PROTIME-INR - Abnormal; Notable for the following:       Result Value   Prothrombin Time 24.6 (*)    All other components within normal limits  APTT - Abnormal; Notable for the following:    aPTT 69 (*)    All other components within normal limits  COMPREHENSIVE METABOLIC PANEL - Abnormal; Notable for the following:    Glucose, Bld 285 (*)    BUN 31 (*)    All other components within normal limits  URINALYSIS, ROUTINE W REFLEX MICROSCOPIC - Abnormal; Notable for the following:    Glucose, UA >=500 (*)    Hgb urine dipstick TRACE (*)    All other components within normal limits  URINALYSIS, MICROSCOPIC (REFLEX) - Abnormal; Notable for the following:    Bacteria, UA FEW (*)    Squamous Epithelial / LPF 0-5 (*)    All other components within normal limits  ETHANOL  CBC  DIFFERENTIAL  RAPID URINE DRUG SCREEN, HOSP PERFORMED  TROPONIN I    EKG  EKG Interpretation  Date/Time:  Thursday August 01 2016 14:04:22 EDT Ventricular Rate:  94 PR Interval:    QRS Duration: 110 QT Interval:  379 QTC Calculation: 467 R Axis:   39 Text Interpretation:  Sinus rhythm Paired ventricular premature complexes RSR' in V1 or V2, right VCD or RVH Borderline T abnormalities, anterior leads Confirmed by Nerida Boivin  MD, Safa Derner ([id number]) on 08/01/2016 2:22:49 PM       Radiology Ct Head Wo Contrast  Result Date: 08/01/2016 CLINICAL DATA:  Dizziness, right-sided weakness EXAM: CT HEAD WITHOUT CONTRAST TECHNIQUE: Contiguous axial images were obtained from the base of the skull through the vertex without intravenous contrast. COMPARISON:  None. FINDINGS: Brain: The ventricular system is normal in size and configuration and there is mild cortical atrophy present. The septum is midline in position. and old left lacunar infarct is noted. There is also low-attenuation extending peripherally within the left frontal region consistent with a left old left subcortical infarct. No hemorrhage, mass lesion, or  acute infarct is seen. Vascular: No vascular abnormality is noted on this unenhanced study. Skull: On bone window images, no calvarial abnormality is seen. Sinuses/Orbits: The paranasal sinuses are well pneumatized. The orbital rims are intact. Other: None. IMPRESSION: 1. Mild atrophy.  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 2. Probable old small subcortical infarct in the left frontal region. Electronically Signed   By: Dwyane Dee M.D.   On: 08/01/2016 15:38    Procedures Procedures (including critical care time)  Medications Ordered in ED Medications - No data to display   Initial Impression / Assessment and Plan / ED Course  I have reviewed the triage vital signs and the nursing notes.  Pertinent labs & imaging results that were available during my care of the patient were reviewed by me and considered in my medical decision making (see chart for details).     Patient presents with dizziness associated with right-sided weakness. Her head CT is negative. I consulted the hospitalist at [facility] who is accepted the patient for transfer for further stroke evaluation. She's been accepted by Dr. Rhona Leavens.  Final Clinical Impressions(s) / ED Diagnoses   Final diagnoses:  Dizziness  Right sided weakness    New Prescriptions New Prescriptions   No medications on file     Rolan Bucco, MD 08/01/16 [phone]

## 2016-08-01 NOTE — H&P (Signed)
History and Physical    Sydney GarinDiana Mamula ZOX:[id number] DOB: [birth year] DOA: 08/01/2016  Referring MD/NP/PA: Dr. Fredderick PhenixBelfi PCP: Mady GemmaKAPLAN,KRISTEN, PA-C  [facility]  Chief Complaint:  Dizziness  HPI: Sydney GarinDiana Herring is a 66 y.o. right hand dominant female with medical history significant of HTN, DM type II, COPD, HLD, splenic infarct and DVT on Xarelto; who presents with complaints of dizziness since waking up around 10 AM this morning. Prior to today she was in her normal state of health. While she was walking she felt lightheaded and seemed to be leaning toward her right side. Reports bumping into objects around her home. Associated symptoms included intermittent hacking cough and right-sided weakness. States it felt as though she was dragging her right leg and had been dropping objects out of her right hand. Denies having any falls, numbness, speech deficits, change in vision, loss of consciousness, fever, nausea, diarrhea, change in weight,  chest pain, or prior stroke. She expresses that she quit smoking a year or more ago. On average she reports that her blood sugars are in the 200s almost days. Currently taking just metformin. 3 months ago she had a DVT and has been taking Xarelto has prescribed since that time.    ED Course: Upon admission into the emergency department the patient was seen to be afebrile, pulse 46-91, respirations 12-22, blood pressure up to 163/105, and O2 saturations maintained on room air. Lab work revealed BUN 31, creatinine 0.87, glucose 285, and troponin negative. CT scan of the brain showed an old left frontal subcortical infarct, but no acute abnormalities. Patient was transferred to [facility] tele[facility] for further workup.  Review of Systems: As per HPI otherwise 10 point review of systems negative.   Past Medical History:  Diagnosis Date  . COPD (chronic obstructive pulmonary disease) (HCC)   . Diabetes mellitus without complication (HCC)    . DVT (deep venous thrombosis) (HCC)   . Gastroesophageal reflux   . Hyperlipidemia   . Hypertension   . Peripheral vascular disease (HCC)   . 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 Vitamin D deficiency     Past Surgical History:  Procedure Laterality Date  . ELBOW SURGERY    . EYE SURGERY    . TEE WITHOUT CARDIOVERSION N/A 05/08/2016   Procedure: TRANSESOPHAGEAL ECHOCARDIOGRAM (TEE);  Surgeon: Orpah CobbAjay Kadakia, MD;  [facility];  Service: Cardiovascular;  Laterality: N/A;  . WISDOM TOOTH EXTRACTION       reports that she quit smoking about a year ago. She has never used smokeless tobacco. She reports that she drinks alcohol. She reports that she does not use drugs.  No Known Allergies  No family history on file.  Prior to Admission medications   Medication Sig Start Date End Date Taking? Authorizing Provider  Probiotic Product (PROBIOTIC PO) Take by mouth.   Yes Historical Provider, MD  albuterol (PROVENTIL HFA) 108 (90 Base) MCG/ACT inhaler Inhale 2 puffs into the lungs every 4 (four) hours as needed for wheezing or shortness of breath.    Historical Provider, MD  amLODipine (NORVASC) 2.5 MG tablet Take 2.5 mg by mouth daily.    Historical Provider, MD  atorvastatin (LIPITOR) 10 MG tablet Take 10 mg by mouth daily.    Historical Provider, MD  Fluticasone-Salmeterol (ADVAIR) 250-50 MCG/DOSE AEPB Inhale 1 puff into the lungs 2 (two) times daily as needed (for shortness of breath).     Historical Provider, MD  lansoprazole (PREVACID) 15 MG capsule Take  15 mg by mouth daily. 04/21/16   Historical Provider, MD  metFORMIN (GLUCOPHAGE-XR) 750 MG 24 hr tablet Take 750 mg by mouth daily with breakfast.    Historical Provider, MD  rivaroxaban (XARELTO) 20 MG TABS tablet Take 20 mg by mouth daily. 05/15/16   Historical Provider, MD    Physical Exam: Constitutional: NAD, calm, comfortable Vitals:   08/01/16 1458 08/01/16 1600 08/01/16 1800 08/01/16 1838  BP: (!) 144/78 (!) 145/91  137/73    Pulse: 82 84  (!) 46  Resp: 18 12  17   Temp:   98.3 F (36.8 C) 98.3 F (36.8 C)  TempSrc:    Oral  SpO2: 97% 97%  100%  Weight:      Height:    5\' 3"  (1.6 m)   Eyes: PERRL, lids and conjunctivae normal ENMT: Mucous membranes are moist. Posterior pharynx clear of any exudate or lesions.Normal dentition.  Neck: normal, supple, no masses, no thyromegaly Respiratory: clear to auscultation bilaterally, no wheezing, no crackles. Normal respiratory effort. No accessory muscle use.  Cardiovascular: Regular rate and rhythm, no murmurs / rubs / gallops. No extremity edema. 2+ pedal pulses. No carotid bruits.  Abdomen: no tenderness, no masses palpated. No hepatosplenomegaly. Bowel sounds positive.  Musculoskeletal: no clubbing / cyanosis. No joint deformity upper and lower extremities. Good ROM, no contractures. Normal muscle tone.  Skin: no rashes, lesions, ulcers. No induration Neurologic: CN 2-12 grossly intact. Sensation intact, DTR normal. Strength 5/5 in all 4.  Psychiatric: Normal judgment and insight. Alert and oriented x 3. Normal mood.     Labs on Admission: I have personally reviewed following labs and imaging studies  CBC:  Recent Labs Lab 08/01/16 1431  WBC 5.2  NEUTROABS 3.4  HGB 13.0  HCT 40.0  MCV 90.5  PLT 300   Basic Metabolic Panel:  Recent Labs Lab 08/01/16 1431  NA 137  K 4.1  CL 104  CO2 27  GLUCOSE 285*  BUN 31*  CREATININE 0.87  CALCIUM 9.0   GFR: Estimated Creatinine Clearance: 61.2 mL/min (by C-G formula based on SCr of 0.87 mg/dL). Liver Function Tests:  Recent Labs Lab 08/01/16 1431  AST 20  ALT 26  ALKPHOS 86  BILITOT 0.9  PROT 7.8  ALBUMIN 4.4   No results for input(s): LIPASE, AMYLASE in the last 168 hours. No results for input(s): AMMONIA in the last 168 hours. Coagulation Profile:  Recent Labs Lab 08/01/16 1431  INR 2.18   Cardiac Enzymes:  Recent Labs Lab 08/01/16 1431  TROPONINI <0.03   BNP (last 3  results) No results for input(s): PROBNP in the last 8760 hours. HbA1C: No results for input(s): HGBA1C in the last 72 hours. CBG:  Recent Labs Lab 08/01/16 1719  GLUCAP 150*   Lipid Profile: No results for input(s): CHOL, HDL, LDLCALC, TRIG, CHOLHDL, LDLDIRECT in the last 72 hours. Thyroid Function Tests: No results for input(s): TSH, T4TOTAL, FREET4, T3FREE, THYROIDAB in the last 72 hours. Anemia Panel: No results for input(s): VITAMINB12, FOLATE, FERRITIN, TIBC, IRON, RETICCTPCT in the last 72 hours. Urine analysis:    Component Value Date/Time   COLORURINE YELLOW 08/01/2016 1441   APPEARANCEUR CLEAR 08/01/2016 1441   LABSPEC 1.007 08/01/2016 1441   PHURINE 5.5 08/01/2016 1441   GLUCOSEU >=500 (A) 08/01/2016 1441   HGBUR TRACE (A) 08/01/2016 1441   BILIRUBINUR NEGATIVE 08/01/2016 1441   KETONESUR NEGATIVE 08/01/2016 1441   PROTEINUR NEGATIVE 08/01/2016 1441   NITRITE NEGATIVE 08/01/2016 1441   LEUKOCYTESUR  NEGATIVE 08/01/2016 1441   Sepsis Labs: No results found for this or any previous visit (from the past 240 hour(s)).   Radiological Exams on Admission: Ct Head Wo Contrast  Result Date: 08/01/2016 CLINICAL DATA:  Dizziness, right-sided weakness EXAM: CT HEAD WITHOUT CONTRAST TECHNIQUE: Contiguous axial images were obtained from the base of the skull through the vertex without intravenous contrast. COMPARISON:  None. FINDINGS: Brain: The ventricular system is normal in size and configuration and there is mild cortical atrophy present. The septum is midline in position. and old left lacunar infarct is noted. There is also low-attenuation extending peripherally within the left frontal region consistent with a left old left subcortical infarct. No hemorrhage, mass lesion, or acute infarct is seen. Vascular: No vascular abnormality is noted on this unenhanced study. Skull: On bone window images, no calvarial abnormality is seen. Sinuses/Orbits: The paranasal sinuses are well  pneumatized. The orbital rims are intact. Other: None. IMPRESSION: 1. Mild atrophy.  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 2. Probable old small subcortical infarct in the left frontal region. Electronically Signed   By: Dwyane Dee M.D.   On: 08/01/2016 15:38    EKG: Independently reviewed. Sinus rhythm with paired PVCs  Assessment/Plan Possible TIA (transient ischemic attack): Acute. Patient reports waking this a.m. with right-sided weakness and lightheadedness. Initial CT scan of the brain negative. - Admit to telemetry bed - TIA/stroke order set initiated - Check TSH, fasting lipid panel, hemoglobin A1c - Check MRI stat - Will consult neurology, if positive MRI  Dizziness(lightheadedness) 2/2 Dehydration: Patient with elevated creatinine to BUN ratio. Suspect secondary to uncontrolled blood sugars. - IV Fluids NS at 100 ml/hr - Check orthostatic vital signs   PVCs: Patient with frequent PVCs on initial EKG. Patient recently had TEE in 04/2016 with EF noted to be 50-55% with no other significant abnormalities. - Check magnesium - Review telemetry overnight   - Consider need to consult cardiology   Diabetes mellitus type 2: Patient on metformin at home, however presents with elevated blood glucose of 285 on admission. Patient's last hemoglobin A1c was 8.7 and 04/2069 - Hypoglycemic protocols - Held metformin - Recheck hemoglobin A1c - CBGs every before meals and at bedtime with sensitive SSI - May need adjustment to medication regimen for diabetes and/or diabetic education   Essential hypertension - Continue amlodipine  COPDWithout complication - Continue pharmacy substitution of Dulera  for Advair   DVT with post thrombotic syndrome - Continue Xarelto  History of tobacco abuse  DVT prophylaxis: Xarelto Code Status: Full Family Communication: Family present at bedside  Disposition Plan: Likely discharge home if medically stable  Consults called: None  Admission  status: Observation  Clydie Braun MD [facility] Pager [phone]  If 7PM-7AM, please contact night-coverage [email or web address] Password [facility]  08/01/2016, 7:15 PM

## 2016-08-01 NOTE — ED Notes (Signed)
ED Provider at bedside. 

## 2016-08-01 NOTE — Progress Notes (Signed)
Pt. Arrived to [facility] from [facility]. HR is unstable, rate 47. Heart Monitor shows multiple PVCs. Admitting MD paged. Tele applied and confirmed.

## 2016-08-01 NOTE — ED Notes (Signed)
[facility] is aware of bed at [facility] and they are preparing for transferring to [facility].

## 2016-08-01 NOTE — Treatment Plan (Addendum)
Discussed case with Dr. Belfi.   66yo with hx DM, HLD, DVT on xarelto presenting to [facility] with dizziness and R sided weakness upon awakening on the morning of visit. Presented to [facility] with minimal difference in strength in in ED currently. Head CT neg.  Accepted patient to met-tele at [facility] to complete stroke r/o and work up. If rules in for CVA, would consult Neuro at that time. 

## 2016-08-01 NOTE — ED Notes (Signed)
Report to Heidi, RN

## 2016-08-01 NOTE — ED Notes (Signed)
Attempted to call report to [phone]; nurse off the floor; this RN contact # provided.

## 2016-08-01 NOTE — ED Triage Notes (Signed)
Pt states she woke this am feeling dizzy-weakness to right hand and feels she is dragging her right leg-pt was ambulated NAD from triage to tx room 2 with EMT escort

## 2016-08-02 ENCOUNTER — Encounter (HOSPITAL_COMMUNITY): Payer: Self-pay | Admitting: Radiology

## 2016-08-02 ENCOUNTER — Observation Stay (HOSPITAL_COMMUNITY): Payer: Commercial Managed Care - PPO

## 2016-08-02 DIAGNOSIS — Z8673 Personal history of transient ischemic attack (TIA), and cerebral infarction without residual deficits: Secondary | ICD-10-CM | POA: Diagnosis not present

## 2016-08-02 DIAGNOSIS — R29702 NIHSS score 2: Secondary | ICD-10-CM | POA: Diagnosis present

## 2016-08-02 DIAGNOSIS — K219 Gastro-esophageal reflux disease without esophagitis: Secondary | ICD-10-CM | POA: Diagnosis present

## 2016-08-02 DIAGNOSIS — I10 Essential (primary) hypertension: Secondary | ICD-10-CM

## 2016-08-02 DIAGNOSIS — I87009 Postthrombotic syndrome without complications of unspecified extremity: Secondary | ICD-10-CM | POA: Diagnosis present

## 2016-08-02 DIAGNOSIS — I493 Ventricular premature depolarization: Secondary | ICD-10-CM | POA: Diagnosis present

## 2016-08-02 DIAGNOSIS — G8194 Hemiplegia, unspecified affecting left nondominant side: Secondary | ICD-10-CM | POA: Diagnosis present

## 2016-08-02 DIAGNOSIS — Z7951 Long term (current) use of inhaled steroids: Secondary | ICD-10-CM | POA: Diagnosis not present

## 2016-08-02 DIAGNOSIS — Z6827 Body mass index (BMI) 27.0-27.9, adult: Secondary | ICD-10-CM | POA: Diagnosis not present

## 2016-08-02 DIAGNOSIS — J449 Chronic obstructive pulmonary disease, unspecified: Secondary | ICD-10-CM | POA: Diagnosis not present

## 2016-08-02 DIAGNOSIS — R531 Weakness: Secondary | ICD-10-CM | POA: Diagnosis not present

## 2016-08-02 DIAGNOSIS — E785 Hyperlipidemia, unspecified: Secondary | ICD-10-CM | POA: Diagnosis present

## 2016-08-02 DIAGNOSIS — I6789 Other cerebrovascular disease: Secondary | ICD-10-CM | POA: Diagnosis not present

## 2016-08-02 DIAGNOSIS — R42 Dizziness and giddiness: Secondary | ICD-10-CM | POA: Diagnosis present

## 2016-08-02 DIAGNOSIS — Z79899 Other long term (current) drug therapy: Secondary | ICD-10-CM | POA: Diagnosis not present

## 2016-08-02 DIAGNOSIS — Z87891 Personal history of nicotine dependence: Secondary | ICD-10-CM | POA: Diagnosis not present

## 2016-08-02 DIAGNOSIS — I638 Other cerebral infarction: Secondary | ICD-10-CM | POA: Diagnosis not present

## 2016-08-02 DIAGNOSIS — Z823 Family history of stroke: Secondary | ICD-10-CM | POA: Diagnosis not present

## 2016-08-02 DIAGNOSIS — I639 Cerebral infarction, unspecified: Secondary | ICD-10-CM | POA: Diagnosis present

## 2016-08-02 DIAGNOSIS — I82532 Chronic embolism and thrombosis of left popliteal vein: Secondary | ICD-10-CM | POA: Diagnosis present

## 2016-08-02 DIAGNOSIS — E663 Overweight: Secondary | ICD-10-CM | POA: Diagnosis present

## 2016-08-02 DIAGNOSIS — R269 Unspecified abnormalities of gait and mobility: Secondary | ICD-10-CM | POA: Diagnosis present

## 2016-08-02 DIAGNOSIS — Z7901 Long term (current) use of anticoagulants: Secondary | ICD-10-CM | POA: Diagnosis not present

## 2016-08-02 DIAGNOSIS — I6349 Cerebral infarction due to embolism of other cerebral artery: Secondary | ICD-10-CM | POA: Diagnosis not present

## 2016-08-02 DIAGNOSIS — Z7984 Long term (current) use of oral hypoglycemic drugs: Secondary | ICD-10-CM | POA: Diagnosis not present

## 2016-08-02 DIAGNOSIS — E1151 Type 2 diabetes mellitus with diabetic peripheral angiopathy without gangrene: Secondary | ICD-10-CM | POA: Diagnosis present

## 2016-08-02 DIAGNOSIS — E860 Dehydration: Secondary | ICD-10-CM | POA: Diagnosis present

## 2016-08-02 LAB — CBC WITH DIFFERENTIAL/PLATELET
BASOS ABS: 0 10*3/uL (ref 0.0–0.1)
Basophils Relative: 1 %
Eosinophils Absolute: 0.1 10*3/uL (ref 0.0–0.7)
Eosinophils Relative: 2 %
HEMATOCRIT: 39.1 % (ref 36.0–46.0)
Hemoglobin: 12.8 g/dL (ref 12.0–15.0)
LYMPHS PCT: 47 %
Lymphs Abs: 2.7 10*3/uL (ref 0.7–4.0)
MCH: 29.8 pg (ref 26.0–34.0)
MCHC: 32.7 g/dL (ref 30.0–36.0)
MCV: 90.9 fL (ref 78.0–100.0)
MONO ABS: 0.5 10*3/uL (ref 0.1–1.0)
Monocytes Relative: 8 %
NEUTROS ABS: 2.3 10*3/uL (ref 1.7–7.7)
Neutrophils Relative %: 42 %
Platelets: 288 10*3/uL (ref 150–400)
RBC: 4.3 MIL/uL (ref 3.87–5.11)
RDW: 14.6 % (ref 11.5–15.5)
WBC: 5.6 10*3/uL (ref 4.0–10.5)

## 2016-08-02 LAB — BASIC METABOLIC PANEL
ANION GAP: 10 (ref 5–15)
BUN: 19 mg/dL (ref 6–20)
CALCIUM: 9.1 mg/dL (ref 8.9–10.3)
CO2: 27 mmol/L (ref 22–32)
Chloride: 106 mmol/L (ref 101–111)
Creatinine, Ser: 0.79 mg/dL (ref 0.44–1.00)
GFR calc Af Amer: >60 mL/min (ref >60)
GLUCOSE: 147 mg/dL — AB (ref 65–99)
POTASSIUM: 3.9 mmol/L (ref 3.5–5.1)
Sodium: 143 mmol/L (ref 135–145)

## 2016-08-02 LAB — LIPID PANEL
Cholesterol: 129 mg/dL (ref 0–200)
HDL: 40 mg/dL — ABNORMAL LOW (ref >40)
LDL Cholesterol: 57 mg/dL (ref 0–99)
TRIGLYCERIDES: 161 mg/dL — AB (ref <150)
Total CHOL/HDL Ratio: 3.2 RATIO
VLDL: 32 mg/dL (ref 0–40)

## 2016-08-02 LAB — GLUCOSE, CAPILLARY
GLUCOSE-CAPILLARY: 117 mg/dL — AB (ref 65–99)
Glucose-Capillary: 118 mg/dL — ABNORMAL HIGH (ref 65–99)
Glucose-Capillary: 170 mg/dL — ABNORMAL HIGH (ref 65–99)
Glucose-Capillary: 156 mg/dL — ABNORMAL HIGH (ref 65–99)

## 2016-08-02 MED ORDER — IOPAMIDOL (ISOVUE-370) INJECTION 76%
INTRAVENOUS | Status: AC
  Administered 2016-08-02: 50 mL
  Filled 2016-08-02: qty 50

## 2016-08-02 NOTE — Progress Notes (Signed)
Patient is dragging left leg this morning was not as noticeable through out the night appears to be worse this morning.

## 2016-08-02 NOTE — Progress Notes (Signed)
RN contacted Dr. Saralyn PilarJ Vega for asa dosage. No asa ordered. Defer to stroke team tomorrow

## 2016-08-02 NOTE — Progress Notes (Signed)
PROGRESS NOTE    Drina Jobst  ZOX:[id number] DOB: 1951-03-31 DOA: 08/01/2016 PCP: Lilia Argue    Brief Narrative: 66 y.o. female with a hx of dvt on xarelto.  She woke up with gait imbalance yesterday and felt that was dropping things out of the right side in the morning.  She was admitted for dizziness and this am she was noticed to be weak on the left side and neurology consult was called.     Assessment & Plan:   Principal Problem: CVA - MRI has confirmed - Neurology consulted, further workup for them - Patient currently on xarelto, neurology recommending aspirin as well. No orders placed. Will have nursing discuss with them for specific recommendations. 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) - xarelto    COPD (chronic obstructive pulmonary disease) (HCC) - stable   HLD (hyperlipidemia)   HTN (hypertension) - hold amlodipine given principle problem    Diabetes mellitus with complication (HCC) - continue SSI - carb modified  Diet    PVC (premature ventricular contraction)   Dehydration - resolved   DVT prophylaxis: xarelto Code Status: Full Family Communication: none at bedside Disposition Plan: pending improvement in condition   Consultants:   Neurology   Procedures: None   Antimicrobials: none   Subjective: Pt complained of left leg weakness  Objective: Vitals:   08/02/16 0749 08/02/16 0944 08/02/16 1029 08/02/16 1323  BP: 122/68 123/68  135/67  Pulse: 84 88 81 85  Resp: 18 18 16 18   Temp: 98.8 F (37.1 C) 97.8 F (36.6 C)  98.2 F (36.8 C)  TempSrc: Oral Oral  Oral  SpO2: 97% 98% 97% 97%  Weight:      Height:        Intake/Output Summary (Last 24 hours) at 08/02/16 1527 Last data filed at 08/02/16 1300  Gross per 24 hour  Intake          1188.33 ml  Output                0 ml  Net          1188.33 ml   Filed Weights   08/01/16 1349    Weight: 71.7 kg (158 lb)    Examination:  General exam: Appears calm and comfortable, in nad. Respiratory system: Clear to auscultation. Respiratory effort normal. Cardiovascular system: S1 & S2 heard, RRR. No JVD, murmurs, rubs, gallops or clicks. No pedal edema. Gastrointestinal system: Abdomen is nondistended, soft and nontender. No organomegaly or masses felt. Normal bowel sounds heard. Central nervous system: left leg weakness, no facial asymmetry Extremities: warm, no cyanosis Skin: No rashes, lesions or ulcers, and limited exam  Psychiatry: Mood & affect appropriate.     Data Reviewed: I have personally reviewed following labs and imaging studies  CBC:  Recent Labs Lab 08/01/16 1431 08/02/16 0309  WBC 5.2 5.6  NEUTROABS 3.4 2.3  HGB 13.0 12.8  HCT 40.0 39.1  MCV 90.5 90.9  PLT 300 288   Basic Metabolic Panel:  Recent Labs Lab 08/01/16 1431 08/01/16 2009 08/02/16 0309  NA 137  --  143  K 4.1  --  3.9  CL 104  --  106  CO2 27  --  27  GLUCOSE 285*  --  147*  BUN 31*  --  19  CREATININE 0.87  --  0.79  CALCIUM 9.0  --  9.1  MG  --  2.2  --    GFR: Estimated Creatinine Clearance: 66.5 mL/min (by C-G formula based on SCr of 0.79 mg/dL). Liver Function Tests:  Recent Labs Lab 08/01/16 1431  AST 20  ALT 26  ALKPHOS 86  BILITOT 0.9  PROT 7.8  ALBUMIN 4.4   No results for input(s): LIPASE, AMYLASE in the last 168 hours. No results for input(s): AMMONIA in the last 168 hours. Coagulation Profile:  Recent Labs Lab 08/01/16 1431  INR 2.18   Cardiac Enzymes:  Recent Labs Lab 08/01/16 1431  TROPONINI <0.03   BNP (last 3 results) No results for input(s): PROBNP in the last 8760 hours. HbA1C: No results for input(s): HGBA1C in the last 72 hours. CBG:  Recent Labs Lab 08/01/16 1719 08/01/16 2121 08/02/16 0639 08/02/16 1129  GLUCAP 150* 102* 118* 170*   Lipid Profile:  Recent Labs  08/02/16 0309  CHOL 129  HDL 40*  LDLCALC 57   TRIG 161*  CHOLHDL 3.2   Thyroid Function Tests:  Recent Labs  08/01/16 2009  TSH 1.326   Anemia Panel: No results for input(s): VITAMINB12, FOLATE, FERRITIN, TIBC, IRON, RETICCTPCT in the last 72 hours. Sepsis Labs: No results for input(s): PROCALCITON, LATICACIDVEN in the last 168 hours.  No results found for this or any previous visit (from the past 240 hour(s)).       Radiology Studies: Ct Angio Head W Or Wo Contrast  Result Date: 08/02/2016 CLINICAL DATA:  Dizziness.  Right hand weakness since yesterday. EXAM: CT ANGIOGRAPHY HEAD AND NECK TECHNIQUE: Multidetector CT imaging of the head and neck was performed using the standard protocol during bolus administration of intravenous contrast. Multiplanar CT image reconstructions and MIPs were obtained to evaluate the vascular anatomy. Carotid stenosis measurements (when applicable) are obtained utilizing NASCET criteria, using the distal internal carotid diameter as the denominator. CONTRAST:  50 cc Isovue 370 COMPARISON:  CT and MRI done yesterday. FINDINGS: CTA NECK FINDINGS Aortic arch: Atherosclerosis of the aortic arch. No aneurysm or dissection. Branching pattern of the brachiocephalic vessels is normal without origin stenosis. Right carotid system: Common carotid artery widely patent to the bifurcation. No atherosclerotic change at the right carotid bifurcation. Cervical internal carotid artery is normal. Left carotid system: Common carotid artery widely patent to the bifurcation. No atherosclerotic change at the bifurcation. Cervical internal carotid artery is normal. Vertebral arteries: Both vertebral artery origins widely patent with the left being dominant. Both vertebral arteries widely patent through the cervical region. 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 Other neck: No mass or lymphadenopathy. Upper chest: Early emphysematous change. Review of the MIP images confirms the above findings CTA HEAD  FINDINGS Anterior circulation: Both internal carotid arteries widely patent through the skullbase and siphon regions. The anterior and middle cerebral vessels are patent without proximal stenosis, aneurysm or vascular malformation. Posterior circulation: Both vertebral arteries widely patent to the basilar. No basilar stenosis. Posterior circulation branch vessels are normal. Venous sinuses: Patent and normal. Anatomic variants: None significant Delayed phase: No abnormal enhancement Review of the MIP images confirms the above findings IMPRESSION: No significant finding on this study. Mild atherosclerotic disease of the aortic arch. No large or medium vessel intracranial finding. Electronically Signed   By: Paulina FusiMark  Shogry M.D.   On: 08/02/2016 09:37   Ct Head Wo Contrast  Result Date: 08/01/2016 CLINICAL DATA:  Dizziness, right-sided weakness EXAM: CT HEAD WITHOUT CONTRAST TECHNIQUE: Contiguous axial images were obtained from the base of the skull through the vertex without intravenous  contrast. COMPARISON:  None. FINDINGS: Brain: The ventricular system is normal in size and configuration and there is mild cortical atrophy present. The septum is midline in position. and old left lacunar infarct is noted. There is also low-attenuation extending peripherally within the left frontal region consistent with a left old left subcortical infarct. No hemorrhage, mass lesion, or acute infarct is seen. Vascular: No vascular abnormality is noted on this unenhanced study. Skull: On bone window images, no calvarial abnormality is seen. Sinuses/Orbits: The paranasal sinuses are well pneumatized. The orbital rims are intact. Other: None. IMPRESSION: 1. Mild atrophy.  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 2. Probable old small subcortical infarct in the left frontal region. Electronically Signed   By: Dwyane Dee M.D.   On: 08/01/2016 15:38   Ct Angio Neck W Or Wo Contrast  Result Date: 08/02/2016 CLINICAL DATA:  Dizziness.   Right hand weakness since yesterday. EXAM: CT ANGIOGRAPHY HEAD AND NECK TECHNIQUE: Multidetector CT imaging of the head and neck was performed using the standard protocol during bolus administration of intravenous contrast. Multiplanar CT image reconstructions and MIPs were obtained to evaluate the vascular anatomy. Carotid stenosis measurements (when applicable) are obtained utilizing NASCET criteria, using the distal internal carotid diameter as the denominator. CONTRAST:  50 cc Isovue 370 COMPARISON:  CT and MRI done yesterday. FINDINGS: CTA NECK FINDINGS Aortic arch: Atherosclerosis of the aortic arch. No aneurysm or dissection. Branching pattern of the brachiocephalic vessels is normal without origin stenosis. Right carotid system: Common carotid artery widely patent to the bifurcation. No atherosclerotic change at the right carotid bifurcation. Cervical internal carotid artery is normal. Left carotid system: Common carotid artery widely patent to the bifurcation. No atherosclerotic change at the bifurcation. Cervical internal carotid artery is normal. Vertebral arteries: Both vertebral artery origins widely patent with the left being dominant. Both vertebral arteries widely patent through the cervical region. 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 Other neck: No mass or lymphadenopathy. Upper chest: Early emphysematous change. Review of the MIP images confirms the above findings CTA HEAD FINDINGS Anterior circulation: Both internal carotid arteries widely patent through the skullbase and siphon regions. The anterior and middle cerebral vessels are patent without proximal stenosis, aneurysm or vascular malformation. Posterior circulation: Both vertebral arteries widely patent to the basilar. No basilar stenosis. Posterior circulation branch vessels are normal. Venous sinuses: Patent and normal. Anatomic variants: None significant Delayed phase: No abnormal enhancement Review of the MIP  images confirms the above findings IMPRESSION: No significant finding on this study. Mild atherosclerotic disease of the aortic arch. No large or medium vessel intracranial finding. Electronically Signed   By: Paulina Fusi M.D.   On: 08/02/2016 09:37   Mr Brain Wo Contrast  Result Date: 08/01/2016 CLINICAL DATA:  66 y/o  F; dizziness and right-sided weakness. EXAM: MRI HEAD WITHOUT CONTRAST TECHNIQUE: Multiplanar, multiecho pulse sequences of the brain and surrounding structures were obtained without intravenous contrast. COMPARISON:  08/01/2016 CT of the head. FINDINGS: Brain: 6 mm focus of diffusion restriction within the right mid corona radiata (series 4, image 16 and series 400, image 16). 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 Mild chronic microvascular ischemic changes and parenchymal volume loss of the brain. No hydrocephalus or extra-axial collection. No intracranial hemorrhage. Vascular: Normal flow voids. Skull and upper cervical spine: Normal marrow signal. Sinuses/Orbits: Right mastoid tip trace effusion. Mild ethmoid sinus mucosal thickening. Otherwise no significant abnormal signal of paranasal sinuses or mastoid air cells.  Bilateral intra-ocular lens replacement. Other: None. IMPRESSION: 1. 6 mm focus of diffusion restriction within the right mid corona radiata compatible with acute/early subacute infarction. No associated hemorrhage. 2. 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 3. Mild chronic microvascular ischemic changes and mild parenchymal volume loss of the brain. These results will be called to the ordering clinician or representative by the Radiologist Assistant, and communication documented in the PACS or zVision Dashboard. Electronically Signed   By: Mitzi Hansen M.D.   On: 08/01/2016 21:26    Scheduled Meds: . amLODipine  2.5 mg Oral Daily  . atorvastatin  10 mg Oral Daily  . insulin aspart  0-5 Units  Subcutaneous QHS  . insulin aspart  0-9 Units Subcutaneous TID WC  . mometasone-formoterol  2 puff Inhalation BID  . pantoprazole  20 mg Oral Daily  . rivaroxaban  20 mg Oral Daily   Continuous Infusions: . sodium chloride 1,000 mL (08/02/16 0745)     LOS: 0 days    Time spent:> 35 minutes  Penny Pia, MD [facility] Pager [phone]  If 7PM-7AM, please contact night-coverage [email or web address] Password [email or web address] 08/02/2016, 3:27 PM

## 2016-08-02 NOTE — Progress Notes (Signed)
Patient c/o new left hand heaviness, tingling. NIHHS 3 for tingling and left arm and leg drift. States that weakness was in right upper and lower extremity at admission to hospital. VSS. Dr. Saralyn PilarJ. Vega notified. Dr. Saralyn PilarJ Vega to come to bedside to assess patient.

## 2016-08-02 NOTE — Consult Note (Signed)
Neurology Consultation Reason for Consult: left leg weakness Referring Physician: dr Pamala Hurry Kiah Vanalstine  CC: left leg weakness  History is obtained from: patient  HPI: Sydney Herring is a 66 y.o. female with a hx of dvt on xarelto.  She woke up with gait imbalance yesterday and felt that was dropping things out of the right side in the morning.  She was admitted for dizziness and this am she was noticed to be weak on the left side and neurology consult was called.  Pt was a lifelong smoker until 2 years ago.  Aside from xarelto she also takes a baby asa every day.  She has a strong family hx of strokes. Her mother died at 79 from a stroke.   LKW: the night before last tpa given?: no NIHSS =2 1 drift in left arm and 1 drift in left leg    ROS: A 14 point ROS was performed and is negative except as noted in the HPI  Past Medical History:  Diagnosis Date  . COPD (chronic obstructive pulmonary disease) (HCC)   . Diabetes mellitus without complication (HCC)   . DVT (deep venous thrombosis) (HCC)   . Gastroesophageal reflux   . Hyperlipidemia   . Hypertension   . Peripheral vascular disease (HCC)   . 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 Vitamin D deficiency     No family history on file.   Social History:  reports that she quit smoking about a year ago. She has never used smokeless tobacco. She reports that she drinks alcohol. She reports that she does not use drugs.   Exam: Current vital signs: BP 122/68 (BP Location: Left Arm)   Pulse 84   Temp 98.8 F (37.1 C) (Oral)   Resp 18   Ht 5\' 3"  (1.6 m)   Wt 71.7 kg (158 lb)   SpO2 97%   BMI 27.99 kg/m  Vital signs in last 24 hours: Temp:  [97.4 F (36.3 C)-98.8 F (37.1 C)] 98.8 F (37.1 C) (03/16 0749) Pulse Rate:  [46-91] 84 (03/16 0749) Resp:  [12-22] 18 (03/16 0749) BP: (104-163)/(68-105) 122/68 (03/16 0749) SpO2:  [93 %-100 %] 97 % (03/16 0749) Weight:  [71.7 kg (158 lb)] 71.7 kg (158 lb) (03/15 1349)   Physical Exam   Constitutional: Appears well-developed and well-nourished.  Psych: Affect appropriate to situation Eyes: No scleral injection HENT: No OP obstrucion Head: Normocephalic.  Cardiovascular: Normal rate and regular rhythm.  Respiratory: Effort normal and breath sounds normal to anterior ascultation GI: Soft.  No distension. There is no tenderness.  Skin: WDI  Neuro: Mental Status: Patient is awake, alert, oriented to person, place, month, year, and situation. Patient is able to give a clear and coherent history. No signs of aphasia or neglect Cranial Nerves: II: Visual Fields are full. Pupils are equal, round, and reactive to light.  III,IV, VI: EOMI without ptosis or diploplia.  V: Facial sensation is symmetric to temperature VII: Facial movement is symmetric.  VIII: hearing is intact to voice X: Uvula elevates symmetrically XI: Shoulder shrug is symmetric. XII: tongue is midline without atrophy or fasciculations.  Motor: Tone is normal. Bulk is normal. 4/5 left arm and leg and otherwise 5/5 Sensory: Sensation is symmetric to light touch and temperature in the arms and legs. Deep Tendon Reflexes: 2+ and symmetric in the biceps and patellae. Plantars: Toes are downgoing bilaterally. Cerebellar: FNF and HKS are intact bilaterally  I have personally reviewed the MRI and her labs  A/P: new right  corona radiata stroke, which explains her left sided sx.  The old left frontal stroke possibly explains her right sided sx from yesterday morning, although these did not blossom on the exam.  I will order a CTA head and neck.  Please continue both xarelto and asa.  She should get a full stroke w/u and PT/OT /ST assessment.  Stroke service will continue to follow after today.

## 2016-08-02 NOTE — Discharge Instructions (Signed)
Information on my medicine - XARELTO® (Rivaroxaban) ° °This medication education was reviewed with me or my healthcare representative as part of my discharge preparation.   ° °Why was Xarelto® prescribed for you? °Xarelto® was prescribed for you to reduce the risk of a blood clot forming that can cause a stroke if you have a medical condition called atrial fibrillation (a type of irregular heartbeat). ° °What do you need to know about xarelto® ? °Take your Xarelto® 20 mg ONCE DAILY at the same time every day with your evening meal. °If you have difficulty swallowing the tablet whole, you may crush it and mix in applesauce just prior to taking your dose. ° °Take Xarelto® exactly as prescribed by your doctor and DO NOT stop taking Xarelto® without talking to the doctor who prescribed the medication.  Stopping without other stroke prevention medication to take the place of Xarelto® may increase your risk of developing a clot that causes a stroke.  Refill your prescription before you run out. ° °After discharge, you should have regular check-up appointments with your healthcare provider that is prescribing your Xarelto®.  In the future your dose may need to be changed if your kidney function or weight changes by a significant amount. ° °What do you do if you miss a dose? °If you are taking Xarelto® ONCE DAILY and you miss a dose, take it as soon as you remember on the same day then continue your regularly scheduled once daily regimen the next day. Do not take two doses of Xarelto® at the same time or on the same day.  ° °Important Safety Information °A possible side effect of Xarelto® is bleeding. You should call your healthcare provider right away if you experience any of the following: °? Bleeding from an injury or your nose that does not stop. °? Unusual colored urine (red or dark brown) or unusual colored stools (red or black). °? Unusual bruising for unknown reasons. °? A serious fall or if you hit your head (even  if there is no bleeding). ° °Some medicines may interact with Xarelto® and might increase your risk of bleeding while on Xarelto®. To help avoid this, consult your healthcare provider or pharmacist prior to using any new prescription or non-prescription medications, including herbals, vitamins, non-steroidal anti-inflammatory drugs (NSAIDs) and supplements. ° °This website has more information on Xarelto®: [email or web address].com. ° ° °

## 2016-08-02 NOTE — Progress Notes (Signed)
STROKE TEAM PROGRESS NOTE   HISTORY OF PRESENT ILLNESS (per record) Sydney Herring is a 66 y.o. female with a hx of dvt on xarelto.  She woke up with gait imbalance yesterday and felt that was dropping things out of the right side in the morning.  She was admitted for dizziness and this am she was noticed to be weak on the left side and neurology consult was called.  Pt was a lifelong smoker until 2 years ago.  Aside from xarelto she also takes a baby asa every day.  She has a strong family hx of strokes. Her mother died at 9 from a stroke.   LKW: the night before last tpa given?: no NIHSS =2 1 drift in left arm and 1 drift in left leg   SUBJECTIVE (INTERVAL HISTORY) Her family is not at the bedside.  She reports feeling much better and denies complaints for the full ROS.  She informed me that she has an appointment to see Hematology for hypercoagulable work scheduled for April 3rd   OBJECTIVE Temp:  [97.7 F (36.5 C)-98.8 F (37.1 C)] 98.2 F (36.8 C) (03/16 1323) Pulse Rate:  [46-88] 85 (03/16 1323) Cardiac Rhythm: Normal sinus rhythm (03/16 0700) Resp:  [12-18] 18 (03/16 1323) BP: (104-145)/(67-91) 135/67 (03/16 1323) SpO2:  [95 %-100 %] 97 % (03/16 1323)  CBC:   Recent Labs Lab 08/01/16 1431 08/02/16 0309  WBC 5.2 5.6  NEUTROABS 3.4 2.3  HGB 13.0 12.8  HCT 40.0 39.1  MCV 90.5 90.9  PLT 300 288    Basic Metabolic Panel:   Recent Labs Lab 08/01/16 1431 08/01/16 2009 08/02/16 0309  NA 137  --  143  K 4.1  --  3.9  CL 104  --  106  CO2 27  --  27  GLUCOSE 285*  --  147*  BUN 31*  --  19  CREATININE 0.87  --  0.79  CALCIUM 9.0  --  9.1  MG  --  2.2  --     Lipid Panel:     Component Value Date/Time   CHOL 129 08/02/2016 0309   TRIG 161 (H) 08/02/2016 0309   HDL 40 (L) 08/02/2016 0309   CHOLHDL 3.2 08/02/2016 0309   VLDL 32 08/02/2016 0309   LDLCALC 57 08/02/2016 0309   HgbA1c:  Lab Results  Component Value Date   HGBA1C 8.7 (H) 05/07/2016    Urine Drug Screen:     Component Value Date/Time   LABOPIA NONE DETECTED 08/01/2016 1443   COCAINSCRNUR NONE DETECTED 08/01/2016 1443   LABBENZ NONE DETECTED 08/01/2016 1443   AMPHETMU NONE DETECTED 08/01/2016 1443   THCU NONE DETECTED 08/01/2016 1443   LABBARB NONE DETECTED 08/01/2016 1443      IMAGING  Ct Angio Head and Neck W Or Wo Contrast 08/02/2016 No significant finding on this study. Mild atherosclerotic disease of the aortic arch. No large or medium vessel intracranial finding.    Ct Head Wo Contrast 08/01/2016 1. Mild atrophy.  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  2. Probable old small subcortical infarct in the left frontal region.     Mr Brain Wo Contrast 08/01/2016 1. 6 mm focus of diffusion restriction within the right mid corona radiata compatible with acute/early subacute infarction. No associated hemorrhage.  2. 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  3. Mild chronic microvascular ischemic changes and mild parenchymal volume loss of the brain.    PHYSICAL EXAM  Physical  Exam  Constitutional: Appears well-developed and well-nourished.  Psych: Affect appropriate to situation Eyes: No scleral injection HENT: No OP obstrucion Head: Normocephalic.  Cardiovascular: Normal rate and regular rhythm.  Respiratory: Effort normal and breath sounds normal to anterior ascultation GI: Soft.  No distension. There is no tenderness.  Skin: WDI  Neuro: Mental Status: Patient is awake, alert, oriented to person, place, month, year, and situation. Patient is able to give a clear and coherent history. No signs of aphasia or neglect Cranial Nerves: II: Visual Fields are full. Pupils are equal, round, and reactive to light.  III,IV, VI: EOMI without ptosis or diploplia.  V: Facial sensation is symmetric to temperature VII: Facial movement is symmetric.  VIII: hearing is intact to voice X: Uvula elevates symmetrically XI: Shoulder  shrug is symmetric. XII: tongue is midline without atrophy or fasciculations.  Motor: Tone is normal. Bulk is normal. 4/5 left arm and leg and otherwise 5/5 Sensory: Sensation is symmetric to light touch and temperature in the arms and legs. Deep Tendon Reflexes: 2+ and symmetric in the biceps and patellae. Plantars: Toes are downgoing bilaterally. Cerebellar: FNF and HKS are intact bilaterally   ASSESSMENT/PLAN Ms. Sydney GarinDiana Herring is a 66 y.o. female with history of peripheral vascular disease, hypertension, hyperlipidemia, DVT history ( on Xarelto), diabetes mellitus, and COPD presenting with gait imbalance, dizziness, and left-sided weakness . She did not receive IV t-PA due to late presentation.  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  MRA - not performed.  UDS - negative  CTA H&N - unremarkable  Carotid Doppler - CTA neck  2D Echo - TEE 05/08/2016 - EF 50-55%. No cardiac source of emboli identified.  LDL - 57  HgbA1c - 7.8  VTE prophylaxis - Xarelto Diet heart healthy/carb modified Room service appropriate? Yes; Fluid consistency: Thin  Xarelto (rivaroxaban) daily prior to admission, now on Xarelto (rivaroxaban) daily  Patient counseled to be compliant with her antithrombotic medications  Ongoing aggressive stroke risk factor management  Therapy recommendations: pending  Disposition: Pending  Hypertension  Stable  Permissive hypertension (OK if < 220/120) but gradually normalize in 5-7 days  Long-term BP goal normotensive  Hyperlipidemia  Home meds:  Lipitor 10 mg daily resumed in hospital  LDL 57, goal < 70  Continue statin at discharge   Diabetes  HgbA1c 7.8, goal < 7.0  Uncontrolled  Other Stroke Risk Factors  Advanced age  Quit smoking 2  years ago.  ETOH use, advised to drink no more than 1 - 2 drink(s) a day  Overweight, Body mass index is 27.99 kg/m., recommend weight loss, diet and exercise as appropriate   Hx stroke/TIA - by imaging  Family history of strokes  Hospital day # 0  ATTENDING NOTE: Patient was seen and examined by me personally. Documentation reflects findings. The laboratory and radiographic studies reviewed by me. ROS completed by me personally and pertinent positives fully documented  Condition:  Stable  Assessment and plan completed by me personally and fully documented above. Plans/Recommendations include:     Agree with continued anticoagulation.  Patient was also taking ASA 81mg  daily at the time of event.  Would consider restarting ASA 81mg  for now.  Patient will need comprehensive Hematology evaluation.  First DVT was at 66yo with no clear precipitating factors.  She was  on coumadin until four years ago and taken off.  She was recently admitted with splenic infarction.  TEE on 05/08/2016 did not show PFO.  Repeat today showed EF 50-55% and no cardiac source.  Thus planning around long term anticoagulation will be based on Heme work-up  Recommend diabetes education  Patient concerned about ability to care for self and drive to appointments like the one for Heme.  May need case management consult to ensure that patient able to keep follow-up MD appointments  LDL good  Patient should see Stroke Team as outpatient in 4-6 weeks  No further recommendations at this time  SIGNED BY: Dr. Sula Soda    To contact Stroke Continuity provider, please refer to [email or web address]. After hours, contact General Neurology

## 2016-08-03 ENCOUNTER — Inpatient Hospital Stay (HOSPITAL_COMMUNITY): Payer: Commercial Managed Care - PPO

## 2016-08-03 DIAGNOSIS — I639 Cerebral infarction, unspecified: Secondary | ICD-10-CM

## 2016-08-03 DIAGNOSIS — I6789 Other cerebrovascular disease: Secondary | ICD-10-CM

## 2016-08-03 DIAGNOSIS — I6349 Cerebral infarction due to embolism of other cerebral artery: Secondary | ICD-10-CM

## 2016-08-03 LAB — GLUCOSE, CAPILLARY
Glucose-Capillary: 136 mg/dL — ABNORMAL HIGH (ref 65–99)
Glucose-Capillary: 135 mg/dL — ABNORMAL HIGH (ref 65–99)
Glucose-Capillary: 108 mg/dL — ABNORMAL HIGH (ref 65–99)
Glucose-Capillary: 190 mg/dL — ABNORMAL HIGH (ref 65–99)

## 2016-08-03 LAB — ECHOCARDIOGRAM COMPLETE
Height: 63 in
Weight: 2528 oz

## 2016-08-03 LAB — HEMOGLOBIN A1C
HEMOGLOBIN A1C: 7.8 % — AB (ref 4.8–5.6)
Mean Plasma Glucose: 177 mg/dL

## 2016-08-03 MED ORDER — RIVAROXABAN 20 MG PO TABS: 20.0000 mg | ORAL_TABLET | Freq: Every day | ORAL | Status: DC

## 2016-08-03 NOTE — Progress Notes (Signed)
  Echocardiogram 2D Echocardiogram has been performed.  Sydney Herring, Sydney Herring 08/03/2016, 10:33 AM

## 2016-08-03 NOTE — Progress Notes (Signed)
PROGRESS NOTE    Sydney Herring  BJY:[id number] DOB: October 21, 1950 DOA: 08/01/2016 PCP: Lilia Argue    Brief Narrative: 66 y.o. female with a hx of dvt on xarelto.  She woke up with gait imbalance yesterday and felt that was dropping things out of the right side in the morning.  She was admitted for dizziness and this am she was noticed to be weak on the left side and neurology consult was called.     Assessment & Plan:   Principal Problem: CVA - MRI has confirmed - Neurology consulted, and managing work up [facility] - anticoagulation recommendations per neuro 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) - xarelto    COPD (chronic obstructive pulmonary disease) (HCC) - stable   HLD (hyperlipidemia)   HTN (hypertension) - hold amlodipine given principle problem    Diabetes mellitus with complication (HCC) - continue SSI - carb modified  Diet    PVC (premature ventricular contraction)   Dehydration - resolved   DVT prophylaxis: xarelto Code Status: Full Family Communication: none at bedside Disposition Plan: Once cleared for discharge by neurology   Consultants:   Neurology   Procedures: None   Antimicrobials: none   Subjective: Pt has no new complaints. She is looking forward to talking to neurology.  Objective: Vitals:   08/03/16 0100 08/03/16 0500 08/03/16 0942 08/03/16 1036  BP: 121/85 106/79  131/73  Pulse: 80 75 (!) 103 83  Resp: 20 20 16 18   Temp: 98 F (36.7 C) 98 F (36.7 C)  98.4 F (36.9 C)  TempSrc: Oral Oral  Oral  SpO2: 98%  98% 98%  Weight:      Height:       No intake or output data in the 24 hours ending 08/03/16 1314 Filed Weights   08/01/16 1349  Weight: 71.7 kg (158 lb)    Examination:  General exam: Appears calm and comfortable, in nad. Respiratory system: Clear to auscultation. Respiratory effort normal. Cardiovascular system: S1 & S2  heard, RRR. No JVD, murmurs, rubs, gallops or clicks. No pedal edema. Gastrointestinal system: Abdomen is nondistended, soft and nontender. No organomegaly or masses felt. Normal bowel sounds heard. Central nervous system: left leg weakness, no facial asymmetry Extremities: warm, no cyanosis Skin: No rashes, lesions or ulcers, and limited exam  Psychiatry: Mood & affect appropriate.   Data Reviewed: I have personally reviewed following labs and imaging studies  CBC:  Recent Labs Lab 08/01/16 1431 08/02/16 0309  WBC 5.2 5.6  NEUTROABS 3.4 2.3  HGB 13.0 12.8  HCT 40.0 39.1  MCV 90.5 90.9  PLT 300 288   Basic Metabolic Panel:  Recent Labs Lab 08/01/16 1431 08/01/16 2009 08/02/16 0309  NA 137  --  143  K 4.1  --  3.9  CL 104  --  106  CO2 27  --  27  GLUCOSE 285*  --  147*  BUN 31*  --  19  CREATININE 0.87  --  0.79  CALCIUM 9.0  --  9.1  MG  --  2.2  --    GFR: Estimated Creatinine Clearance: 66.5 mL/min (by C-G formula based on SCr of 0.79 mg/dL). Liver Function Tests:  Recent Labs Lab 08/01/16 1431  AST 20  ALT 26  ALKPHOS 86  BILITOT 0.9  PROT 7.8  ALBUMIN 4.4   No results for input(s): LIPASE, AMYLASE in the last 168 hours. No results  for input(s): AMMONIA in the last 168 hours. Coagulation Profile:  Recent Labs Lab 08/01/16 1431  INR 2.18   Cardiac Enzymes:  Recent Labs Lab 08/01/16 1431  TROPONINI <0.03   BNP (last 3 results) No results for input(s): PROBNP in the last 8760 hours. HbA1C:  Recent Labs  08/02/16 0309  HGBA1C 7.8*   CBG:  Recent Labs Lab 08/02/16 1129 08/02/16 1641 08/02/16 2144 08/03/16 0604 08/03/16 1140  GLUCAP 170* 117* 156* 136* 135*   Lipid Profile:  Recent Labs  08/02/16 0309  CHOL 129  HDL 40*  LDLCALC 57  TRIG 161*  CHOLHDL 3.2   Thyroid Function Tests:  Recent Labs  08/01/16 2009  TSH 1.326   Anemia Panel: No results for input(s): VITAMINB12, FOLATE, FERRITIN, TIBC, IRON, RETICCTPCT  in the last 72 hours. Sepsis Labs: No results for input(s): PROCALCITON, LATICACIDVEN in the last 168 hours.  No results found for this or any previous visit (from the past 240 hour(s)).       Radiology Studies: Ct Angio Head W Or Wo Contrast  Result Date: 08/02/2016 CLINICAL DATA:  Dizziness.  Right hand weakness since yesterday. EXAM: CT ANGIOGRAPHY HEAD AND NECK TECHNIQUE: Multidetector CT imaging of the head and neck was performed using the standard protocol during bolus administration of intravenous contrast. Multiplanar CT image reconstructions and MIPs were obtained to evaluate the vascular anatomy. Carotid stenosis measurements (when applicable) are obtained utilizing NASCET criteria, using the distal internal carotid diameter as the denominator. CONTRAST:  50 cc Isovue 370 COMPARISON:  CT and MRI done yesterday. FINDINGS: CTA NECK FINDINGS Aortic arch: Atherosclerosis of the aortic arch. No aneurysm or dissection. Branching pattern of the brachiocephalic vessels is normal without origin stenosis. Right carotid system: Common carotid artery widely patent to the bifurcation. No atherosclerotic change at the right carotid bifurcation. Cervical internal carotid artery is normal. Left carotid system: Common carotid artery widely patent to the bifurcation. No atherosclerotic change at the bifurcation. Cervical internal carotid artery is normal. Vertebral arteries: Both vertebral artery origins widely patent with the left being dominant. Both vertebral arteries widely patent through the cervical region. 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 Other neck: No mass or lymphadenopathy. Upper chest: Early emphysematous change. Review of the MIP images confirms the above findings CTA HEAD FINDINGS Anterior circulation: Both internal carotid arteries widely patent through the skullbase and siphon regions. The anterior and middle cerebral vessels are patent without proximal stenosis,  aneurysm or vascular malformation. Posterior circulation: Both vertebral arteries widely patent to the basilar. No basilar stenosis. Posterior circulation branch vessels are normal. Venous sinuses: Patent and normal. Anatomic variants: None significant Delayed phase: No abnormal enhancement Review of the MIP images confirms the above findings IMPRESSION: No significant finding on this study. Mild atherosclerotic disease of the aortic arch. No large or medium vessel intracranial finding. Electronically Signed   By: Paulina Fusi M.D.   On: 08/02/2016 09:37   Ct Head Wo Contrast  Result Date: 08/01/2016 CLINICAL DATA:  Dizziness, right-sided weakness EXAM: CT HEAD WITHOUT CONTRAST TECHNIQUE: Contiguous axial images were obtained from the base of the skull through the vertex without intravenous contrast. COMPARISON:  None. FINDINGS: Brain: The ventricular system is normal in size and configuration and there is mild cortical atrophy present. The septum is midline in position. and old left lacunar infarct is noted. There is also low-attenuation extending peripherally within the left frontal region consistent with a left old left subcortical infarct. No hemorrhage, mass lesion, or  acute infarct is seen. Vascular: No vascular abnormality is noted on this unenhanced study. Skull: On bone window images, no calvarial abnormality is seen. Sinuses/Orbits: The paranasal sinuses are well pneumatized. The orbital rims are intact. Other: None. IMPRESSION: 1. Mild atrophy.  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 2. Probable old small subcortical infarct in the left frontal region. Electronically Signed   By: Dwyane DeePaul  Barry M.D.   On: 08/01/2016 15:38   Ct Angio Neck W Or Wo Contrast  Result Date: 08/02/2016 CLINICAL DATA:  Dizziness.  Right hand weakness since yesterday. EXAM: CT ANGIOGRAPHY HEAD AND NECK TECHNIQUE: Multidetector CT imaging of the head and neck was performed using the standard protocol during bolus  administration of intravenous contrast. Multiplanar CT image reconstructions and MIPs were obtained to evaluate the vascular anatomy. Carotid stenosis measurements (when applicable) are obtained utilizing NASCET criteria, using the distal internal carotid diameter as the denominator. CONTRAST:  50 cc Isovue 370 COMPARISON:  CT and MRI done yesterday. FINDINGS: CTA NECK FINDINGS Aortic arch: Atherosclerosis of the aortic arch. No aneurysm or dissection. Branching pattern of the brachiocephalic vessels is normal without origin stenosis. Right carotid system: Common carotid artery widely patent to the bifurcation. No atherosclerotic change at the right carotid bifurcation. Cervical internal carotid artery is normal. Left carotid system: Common carotid artery widely patent to the bifurcation. No atherosclerotic change at the bifurcation. Cervical internal carotid artery is normal. Vertebral arteries: Both vertebral artery origins widely patent with the left being dominant. Both vertebral arteries widely patent through the cervical region. 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 Other neck: No mass or lymphadenopathy. Upper chest: Early emphysematous change. Review of the MIP images confirms the above findings CTA HEAD FINDINGS Anterior circulation: Both internal carotid arteries widely patent through the skullbase and siphon regions. The anterior and middle cerebral vessels are patent without proximal stenosis, aneurysm or vascular malformation. Posterior circulation: Both vertebral arteries widely patent to the basilar. No basilar stenosis. Posterior circulation branch vessels are normal. Venous sinuses: Patent and normal. Anatomic variants: None significant Delayed phase: No abnormal enhancement Review of the MIP images confirms the above findings IMPRESSION: No significant finding on this study. Mild atherosclerotic disease of the aortic arch. No large or medium vessel intracranial finding.  Electronically Signed   By: Paulina FusiMark  Shogry M.D.   On: 08/02/2016 09:37   Mr Brain Wo Contrast  Result Date: 08/01/2016 CLINICAL DATA:  66 y/o  F; dizziness and right-sided weakness. EXAM: MRI HEAD WITHOUT CONTRAST TECHNIQUE: Multiplanar, multiecho pulse sequences of the brain and surrounding structures were obtained without intravenous contrast. COMPARISON:  08/01/2016 CT of the head. FINDINGS: Brain: 6 mm focus of diffusion restriction within the right mid corona radiata (series 4, image 16 and series 400, image 16). 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 Mild chronic microvascular ischemic changes and parenchymal volume loss of the brain. No hydrocephalus or extra-axial collection. No intracranial hemorrhage. Vascular: Normal flow voids. Skull and upper cervical spine: Normal marrow signal. Sinuses/Orbits: Right mastoid tip trace effusion. Mild ethmoid sinus mucosal thickening. Otherwise no significant abnormal signal of paranasal sinuses or mastoid air cells. Bilateral intra-ocular lens replacement. Other: None. IMPRESSION: 1. 6 mm focus of diffusion restriction within the right mid corona radiata compatible with acute/early subacute infarction. No associated hemorrhage. 2. 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 3. Mild chronic microvascular ischemic changes and mild parenchymal volume loss of the brain. These results will be called  to the ordering clinician or representative by the Radiologist Assistant, and communication documented in the PACS or zVision Dashboard. Electronically Signed   By: Mitzi Hansen M.D.   On: 08/01/2016 21:26    Scheduled Meds: . atorvastatin  10 mg Oral Daily  . insulin aspart  0-5 Units Subcutaneous QHS  . insulin aspart  0-9 Units Subcutaneous TID WC  . mometasone-formoterol  2 puff Inhalation BID  . pantoprazole  20 mg Oral Daily  . [START ON 08/04/2016] rivaroxaban  20 mg Oral Q breakfast   Continuous  Infusions:    LOS: 1 day    Time spent:> 35 minutes  Penny Pia, MD [facility] Pager [phone]  If 7PM-7AM, please contact night-coverage [email or web address] Password [email or web address] 08/03/2016, 1:14 PM

## 2016-08-03 NOTE — Evaluation (Signed)
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  Clinical Impression  Pt presented supine in bed with HOB elevated, awake and willing to participate in therapy session. Prior to admission, pt reported that she was independent with all functional mobility and ADLs. Pt currently requires min guard for safety with transfers and ambulation with use of RW. Pt would continue to benefit from skilled physical therapy services at this time while admitted and after d/c to address her below listed limitations in order to improve his overall safety and independence with functional mobility.      Follow Up Recommendations Home health PT    Equipment Recommendations  Rolling walker with 5" wheels;3in1 (PT)    Recommendations for Other Services       Precautions / Restrictions Precautions Precautions: Fall Restrictions Weight Bearing Restrictions: No      Mobility  Bed Mobility Overal bed mobility: Modified Independent                Transfers Overall transfer level: Needs assistance Equipment used: None;Rolling walker (2 wheeled) Transfers: Sit to/from Stand Sit to Stand: Min guard         General transfer comment: increased time, mild instability with rise from bed, min guard for safety  Ambulation/Gait Ambulation/Gait assistance: Min guard Ambulation Distance (Feet): 300 Feet Assistive device: Rolling walker (2 wheeled) Gait Pattern/deviations: Step-through pattern;Decreased step length - right;Decreased stance time - left;Decreased stride length;Decreased dorsiflexion - left;Decreased weight shift to left Gait velocity: decreased Gait velocity interpretation: Below normal speed for age/gender General  Gait Details: noticeable foot drop on L LE; with VC'ing pt able to activate anterior tibialis to allow for adequate ankle DF with swing phase but it's very exaggerated  Stairs Stairs: Yes Stairs assistance: Min guard Stair Management: Two rails;Step to pattern;Forwards Number of Stairs: 2 General stair comments: ascended with R LE leading and descended with L LE leading  Wheelchair Mobility    Modified Rankin (Stroke Patients Only) Modified Rankin (Stroke Patients Only) Pre-Morbid Rankin Score: No symptoms Modified Rankin: Moderate disability     Balance Overall balance assessment: Needs assistance Sitting-balance support: Feet supported Sitting balance-Leahy Scale: Good     Standing balance support: During functional activity;Single extremity supported Standing balance-Leahy Scale: Poor Standing balance comment: pt reliant on at least one UE support on external surface                             Pertinent Vitals/Pain Pain Assessment: No/denies pain    Home Living Family/patient expects to be discharged to:: Private residence Living Arrangements: Spouse/significant other Available Help at Discharge: Family;Available PRN/intermittently Type of Home: House Home Access: Stairs to enter Entrance Stairs-Rails: None Entrance Stairs-Number of Steps: 2 Home Layout: One level Home Equipment: None      Prior Function Level of Independence: Independent               Hand Dominance   Dominant Hand: Right    Extremity/Trunk Assessment   Upper Extremity Assessment Upper Extremity Assessment: Defer to OT evaluation;LUE deficits/detail LUE Deficits / Details: pt with noticeable grip weakness as compared to R. pt also with dysmetria during finger-to-nose testing  LUE Coordination: decreased fine motor;decreased gross motor    Lower Extremity Assessment Lower Extremity Assessment: LLE deficits/detail LLE Deficits / Details: Sensation grossly intact. Pt  with 3+/5 for ankle DF with MMT; 4/5 for hip flexion, hip abduction, hip adduction, knee flexion and knee extension.    Cervical / Trunk Assessment Cervical / Trunk Assessment: Normal  Communication   Communication: No difficulties  Cognition Arousal/Alertness: Awake/alert Behavior During Therapy: WFL for tasks assessed/performed Overall Cognitive Status: Impaired/Different from baseline Area of Impairment: Safety/judgement         Safety/Judgement: Decreased awareness of deficits;Decreased awareness of safety          General Comments      Exercises     Assessment/Plan    PT Assessment Patient needs continued PT services  PT Problem List Decreased strength;Decreased balance;Decreased mobility;Decreased coordination;Decreased knowledge of use of DME;Decreased safety awareness;Decreased knowledge of precautions       PT Treatment Interventions DME instruction;Gait training;Stair training;Functional mobility training;Therapeutic activities;Therapeutic exercise;Balance training;Neuromuscular re-education;Cognitive remediation;Patient/family education    PT Goals (Current goals can be found in the Care Plan section)  Acute Rehab PT Goals Patient Stated Goal: return home, participate in a bowling tournament this summer PT Goal Formulation: With patient Time For Goal Achievement: 08/17/16 Potential to Achieve Goals: Good    Frequency Min 4X/week   Barriers to discharge        Co-evaluation               End of Session Equipment Utilized During Treatment: Gait belt Activity Tolerance: Patient tolerated treatment well Patient left: in bed;with call bell/phone within reach;with bed alarm set Nurse Communication: Mobility status PT Visit Diagnosis: Unsteadiness on feet (R26.81);Other symptoms and signs involving the nervous system (R29.898)         Time: [id number] PT Time Calculation (min) (ACUTE ONLY): 38 min   Charges:   PT Evaluation $PT Eval Moderate  Complexity: 1 Procedure PT Treatments $Gait Training: 23-37 mins   PT G CodesAlessandra Bevels:         Nirav Sweda M Faust [place] 08/03/2016, 1:00 PM Deborah ChalkJennifer Abdulrahman Bracey, PT, DPT [phone]

## 2016-08-04 DIAGNOSIS — J449 Chronic obstructive pulmonary disease, unspecified: Secondary | ICD-10-CM

## 2016-08-04 LAB — GLUCOSE, CAPILLARY
Glucose-Capillary: 144 mg/dL — ABNORMAL HIGH (ref 65–99)
Glucose-Capillary: 164 mg/dL — ABNORMAL HIGH (ref 65–99)
Glucose-Capillary: 117 mg/dL — ABNORMAL HIGH (ref 65–99)

## 2016-08-04 MED ORDER — ASPIRIN 81 MG PO CHEW: 81.0000 mg | CHEWABLE_TABLET | Freq: Every day | ORAL | Status: DC

## 2016-08-04 MED ORDER — ASPIRIN 81 MG PO CHEW: 81.0000 mg | CHEWABLE_TABLET | Freq: Every day | ORAL | 0 refills | Status: DC

## 2016-08-04 NOTE — Evaluation (Signed)
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   Clinical Impression   Pt admitted with the above diagnoses and presents with below problem list. Pt will benefit from continued acute OT to address the below listed deficits and maximize independence with basic ADLs prior to d/c to venue below. PTA pt was independent. Pt is currently min guard with LB ADLs, setup for UB ADLs with cues for compensatory strategies. Presents with L side weakness and LUE dysmetria impacting performance with ADLs.        Follow Up Recommendations  Home health OT;Supervision - Intermittent    Equipment Recommendations  3 in 1 bedside commode    Recommendations for Other Services       Precautions / Restrictions Precautions Precautions: Fall Restrictions Weight Bearing Restrictions: No      Mobility Bed Mobility Overal bed mobility: Modified Independent                Transfers Overall transfer level: Needs assistance Equipment used: Rolling walker (2 wheeled) Transfers: Sit to/from Stand Sit to Stand: Min guard         General transfer comment: increased time, mild instability with rise from bed, min guard for safety    Balance Overall balance assessment: Needs assistance Sitting-balance support: Feet supported Sitting balance-Leahy Scale: Good     Standing balance support: During functional activity;Single extremity supported Standing balance-Leahy Scale: Poor Standing balance comment: pt reliant on at least one UE support on external surface                            ADL Overall ADL's : Needs assistance/impaired Eating/Feeding: Set up;Sitting   Grooming: Set up;Min  guard;Sitting;Standing Grooming Details (indicate cue type and reason): extra time and effort for bilateral tasks Upper Body Bathing: Set up;Sitting;Cueing for compensatory techniques Upper Body Bathing Details (indicate cue type and reason): extra time and effort for bilateral tasks Lower Body Bathing: Min guard;Sit to/from stand;Cueing for compensatory techniques   Upper Body Dressing : Set up;Sitting;Cueing for compensatory techniques   Lower Body Dressing: Min guard;Cueing for compensatory techniques;Sit to/from stand   Toilet Transfer: Min guard;Ambulation;Comfort height toilet;Grab bars   Toileting- Clothing Manipulation and Hygiene: Min guard;Sit to/from stand   Tub/ Shower Transfer: Walk-in shower;Min guard;Ambulation;3 in 1   Functional mobility during ADLs: Min guard;Rolling walker General ADL Comments: Pt completed in-room functional mobility and toilet transfer.  Discussed safety with ADLs at home.      Vision Patient Visual Report: No change from baseline       Perception     Praxis      Pertinent Vitals/Pain Pain Assessment: No/denies pain     Hand Dominance Right   Extremity/Trunk Assessment Upper Extremity Assessment Upper Extremity Assessment: LUE deficits/detail LUE Deficits / Details: 3+/5 grossly. dysmetria noted. sensation intact. LUE Coordination: decreased fine motor;decreased gross motor   Lower Extremity Assessment Lower Extremity Assessment: Defer to PT evaluation   Cervical / Trunk Assessment Cervical / Trunk Assessment: Normal   Communication Communication Communication: No difficulties   Cognition Arousal/Alertness: Awake/alert Behavior During Therapy: WFL for tasks assessed/performed Overall Cognitive Status: Within Functional Limits for tasks assessed  General Comments       Exercises Exercises: Other exercises Other Exercises Other Exercises: Edcuated on activities for treating dysmetria and FMC  deficits. Also issued level 1 theraband and instructed in use.   Shoulder Instructions      Home Living Family/patient expects to be discharged to:: Private residence Living Arrangements: Spouse/significant other Available Help at Discharge: Family;Available PRN/intermittently Type of Home: House Home Access: Stairs to enter [employer] of Steps: 2 Entrance Stairs-Rails: None Home Layout: One level     Bathroom Shower/Tub: Walk-in shower;Tub/shower unit   Bathroom Toilet: Standard     Home Equipment: None          Prior Functioning/Environment Level of Independence: Independent        Comments: bowls at [employer] level         OT Problem List: Decreased strength;Decreased activity tolerance;Impaired balance (sitting and/or standing);Decreased coordination;Decreased knowledge of use of DME or AE;Decreased knowledge of precautions;Impaired tone;Impaired UE functional use      OT Treatment/Interventions: Self-care/ADL training;Therapeutic exercise;Neuromuscular education;DME and/or AE instruction;Therapeutic activities;Patient/family education;Balance training    OT Goals(Current goals can be found in the care plan section) Acute Rehab OT Goals Patient Stated Goal: return home, participate in a bowling tournament this summer OT Goal Formulation: With patient Time For Goal Achievement: 08/18/16 Potential to Achieve Goals: Good ADL Goals Pt Will Perform Upper Body Bathing: with modified independence;sitting Pt Will Perform Lower Body Bathing: with modified independence;sit to/from stand Pt Will Perform Upper Body Dressing: with modified independence;sitting Pt Will Perform Lower Body Dressing: with modified independence;sit to/from stand Pt Will Perform Tub/Shower Transfer: ambulating;3 in 1;with supervision Pt/caregiver will Perform Home Exercise Program: Increased strength;Left upper extremity;With theraband;With written HEP provided;Independently  (also FMC, dysmetria)  OT Frequency: Min 2X/week   Barriers to D/C:            Co-evaluation              End of Session Equipment Utilized During Treatment: Rolling walker  Activity Tolerance: Patient tolerated treatment well Patient left: in bed;with call bell/phone within reach;with bed alarm set  OT Visit Diagnosis: Hemiplegia and hemiparesis;Other (comment) (dysmetria) Hemiplegia - Right/Left: Left Hemiplegia - dominant/non-dominant: Non-Dominant Hemiplegia - caused by: Cerebral infarction                ADL either performed or assessed with clinical judgement  Time: 1345-1415 OT Time Calculation (min): 30 min Charges:  OT General Charges $OT Visit: 1 Procedure OT Evaluation $OT Eval Low Complexity: 1 Procedure OT Treatments $Self Care/Home Management : 8-22 mins G-Codes:       Pilar Grammes 08/04/2016, 3:34 PM

## 2016-08-04 NOTE — Progress Notes (Signed)
Telemetry notified RN of 3-beat run of ventricular tachycardia. Notified MD. Patient is asymptomatic with no other cardiac events this admission.  Possible that patient was moving around in the bed at time of event. Pt states to RN and MD that she would rather discharge this evening and promises to follow up with her primary care physician in the morning. Discharge information given. IV and telemetry discontinued. Paper prescription given. Patient questions asked and answered. Pt transported from unit via wheelchair and will arrive home by family car.  3-in-1 bedside commode and rolling walker provided. Lawson RadarHeather M Monserrath Junio

## 2016-08-04 NOTE — Care Management Note (Signed)
Case Management Note  Patient Details  Name: Sydney Herring MRN: [id number] Date of Birth: 07/28/1950  Subjective/Objective:                  weak on the left side  Action/Plan: Discharge planning Expected Discharge Date:  08/04/16               Expected Discharge Plan:  [place]  In-House Referral:     Discharge planning Services  CM Consult  Post Acute Care Choice:  Home Health Choice offered to:  Patient  DME Arranged:  3-N-1, Walker rolling DME Agency:  [place]:  RN, PT [facility] Agency:  [facility] ([facility]  Status of Service:  Completed, signed off  If discussed at [employer], dates discussed:    Additional Comments: CM met with pt and pt's spouse in room to offer choice of home health agency. Pt chooses [facility] to render HHRn/PT.  Referral called to [facility] rep, [place].  Cm notified [place] DME rep, Reggie to please deliver the rolling walker and 3n1 to room prior to discharge. No other Cm needs were communicated. Dellie Catholic, RN 08/04/2016, 3:58 PM

## 2016-08-06 NOTE — Discharge Summary (Signed)
Physician Discharge Summary  Lafe GarinDiana Mathia AVW:[id number] DOB: Mar 29, 1951 DOA: 08/01/2016  PCP: Lilia ArgueKAPLAN,KRISTEN, PA-C  Admit date: 08/01/2016 Discharge date: 08/06/2016  Time spent: > 35 minutes  Recommendations for Outpatient Follow-up:  1. Ensure f/u with neurologist in 4-6 wks from discharge 2. 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 She woke up with gait imbalance yesterday and felt that was dropping things out of the right side in the morning. She was admitted for dizziness and this am she was noticed to be weak on the left side and neurology consult was called.   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) - xarelto    COPD (chronic obstructive pulmonary disease) (HCC) - stable   HLD (hyperlipidemia)   HTN (hypertension) - hold amlodipine given principle problem can restart 5-7 days from stroke    Diabetes mellitus with complication (HCC) - continue home regimen (prior to admission)    PVC (premature ventricular contraction)   Dehydration - resolved  Procedures:  Stroke  w/u  Consultations:  Neurology: Dr Leory Plowman Gregory  Discharge Exam: Vitals:   08/04/16 1249 08/04/16 1624  BP: 124/71 (!) 142/79  Pulse: 76 73  Resp: 20 18  Temp: 98.3 F (36.8 C) 97.5 F (36.4 C)    General: Pt in nad, alert and awake Cardiovascular: rrr, no rubs Respiratory: no increased wob, no wheezes  Discharge Instructions   Discharge Instructions    Ambulatory referral to Neurology    Complete by:  As directed    Dr. Roda ShuttersXu requests follow up appointment in 2 months with PA or Nurse Practitioner.   Diet - low sodium heart healthy    Complete by:  As directed    Diet Carb Modified    Complete by:  As directed    Discharge instructions    Complete by:  As directed    After discharge. Please call the neurology office for follow-up appointment date and time   Increase activity slowly    Complete by:  As directed      Discharge Medication List as of 08/04/2016  5:25 PM    START taking these medications   Details  aspirin 81 MG chewable tablet Chew 1 tablet (81 mg total) by mouth daily., Starting Sun 08/04/2016, Print      CONTINUE these medications which have NOT CHANGED   Details  acetaminophen (TYLENOL) 325 MG tablet Take 650 mg by mouth every 6 (six) hours as needed for mild pain., Historical Med    albuterol (PROVENTIL HFA) 108 (90 Base) MCG/ACT inhaler Inhale 2 puffs into the lungs every 4 (four) hours as needed for wheezing or shortness of  breath., Historical Med    atorvastatin (LIPITOR) 10 MG tablet Take 10 mg by mouth daily., Historical Med    Fluticasone-Salmeterol (ADVAIR) 250-50 MCG/DOSE AEPB Inhale 1 puff into the lungs 2 (two) times daily as needed (for shortness of breath). , Historical Med    lansoprazole (PREVACID) 15 MG capsule Take 15 mg by mouth daily., Starting Sun 04/21/2016, Historical Med    metFORMIN (GLUCOPHAGE-XR) 750 MG 24 hr tablet Take 750 mg by mouth every evening. , Historical Med    Omega-3 1000 MG CAPS Take 1 capsule by mouth daily.,  Historical Med    Probiotic Product (PROBIOTIC PO) Take 1 tablet by mouth daily. , Historical Med    rivaroxaban (XARELTO) 20 MG TABS tablet Take 20 mg by mouth daily., Starting Wed 05/15/2016, Historical Med      STOP taking these medications     amLODipine (NORVASC) 2.5 MG tablet        No Known Allergies Follow-up Information    Xu,Jindong, MD Follow up in 2 month(s).   Specialty:  Neurology Contact information: [address] [place] [address] [phone]        [facility] Follow up.   Specialty:  Home Health Services Why:  home health agency will call you to schedule your at home therapy Contact information: [address] [place] [address] [phone]        [facility] Follow up.   Why:  rolling walker and 3n1 Contact information: [address] [place] [address] [phone]        [facility] Follow up.   Why:  remember to ask your primary care physician office staff for more information on [facility] Contact information: [facility] [employer]           The results of significant diagnostics from this hospitalization (including imaging, microbiology, ancillary and laboratory) are listed below for reference.    Significant Diagnostic Studies: Ct Angio Head W Or Wo Contrast  Result Date: 08/02/2016 CLINICAL DATA:  Dizziness.  Right hand weakness since yesterday. EXAM: CT ANGIOGRAPHY HEAD AND NECK TECHNIQUE: Multidetector CT imaging of the head and neck was performed using the standard protocol during bolus administration of intravenous contrast. Multiplanar CT image reconstructions and MIPs were obtained to evaluate the vascular anatomy. Carotid stenosis measurements (when applicable) are obtained utilizing NASCET criteria, using the distal internal carotid diameter as the denominator. CONTRAST:  50 cc Isovue 370 COMPARISON:  CT and MRI done yesterday. FINDINGS: CTA NECK FINDINGS  Aortic arch: Atherosclerosis of the aortic arch. No aneurysm or dissection. Branching pattern of the brachiocephalic vessels is normal without origin stenosis. Right carotid system: Common carotid artery widely patent to the bifurcation. No atherosclerotic change at the right carotid bifurcation. Cervical internal carotid artery is normal. Left carotid system: Common carotid artery widely patent to the bifurcation. No atherosclerotic change at the bifurcation. Cervical internal carotid artery is normal. Vertebral arteries: Both vertebral artery origins widely patent with the left being dominant. Both vertebral arteries widely patent through the cervical region. 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 Other neck: No mass or lymphadenopathy. Upper chest: Early emphysematous change. Review of the MIP images confirms the above findings CTA HEAD FINDINGS Anterior circulation: Both internal carotid arteries widely patent through the skullbase and siphon regions. The anterior and middle cerebral vessels are patent without proximal stenosis, aneurysm or vascular malformation. Posterior circulation: Both vertebral arteries widely patent to the basilar.  No basilar stenosis. Posterior circulation branch vessels are normal. Venous sinuses: Patent and normal. Anatomic variants: None significant Delayed phase: No abnormal enhancement Review of the MIP images confirms the above findings IMPRESSION: No significant finding on this study. Mild atherosclerotic disease of the aortic arch. No large or medium vessel intracranial finding. Electronically Signed   By: Paulina Fusi M.D.   On: 08/02/2016 09:37   Ct Head Wo Contrast  Result Date: 08/01/2016 CLINICAL DATA:  Dizziness, right-sided weakness EXAM: CT HEAD WITHOUT CONTRAST TECHNIQUE: Contiguous axial images were obtained from the base of the skull through the vertex without intravenous contrast. COMPARISON:  None. FINDINGS: Brain: The ventricular system is  normal in size and configuration and there is mild cortical atrophy present. The septum is midline in position. and old left lacunar infarct is noted. There is also low-attenuation extending peripherally within the left frontal region consistent with a left old left subcortical infarct. No hemorrhage, mass lesion, or acute infarct is seen. Vascular: No vascular abnormality is noted on this unenhanced study. Skull: On bone window images, no calvarial abnormality is seen. Sinuses/Orbits: The paranasal sinuses are well pneumatized. The orbital rims are intact. Other: None. IMPRESSION: 1. Mild atrophy.  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 2. Probable old small subcortical infarct in the left frontal region. Electronically Signed   By: Dwyane Dee M.D.   On: 08/01/2016 15:38   Ct Angio Neck W Or Wo Contrast  Result Date: 08/02/2016 CLINICAL DATA:  Dizziness.  Right hand weakness since yesterday. EXAM: CT ANGIOGRAPHY HEAD AND NECK TECHNIQUE: Multidetector CT imaging of the head and neck was performed using the standard protocol during bolus administration of intravenous contrast. Multiplanar CT image reconstructions and MIPs were obtained to evaluate the vascular anatomy. Carotid stenosis measurements (when applicable) are obtained utilizing NASCET criteria, using the distal internal carotid diameter as the denominator. CONTRAST:  50 cc Isovue 370 COMPARISON:  CT and MRI done yesterday. FINDINGS: CTA NECK FINDINGS Aortic arch: Atherosclerosis of the aortic arch. No aneurysm or dissection. Branching pattern of the brachiocephalic vessels is normal without origin stenosis. Right carotid system: Common carotid artery widely patent to the bifurcation. No atherosclerotic change at the right carotid bifurcation. Cervical internal carotid artery is normal. Left carotid system: Common carotid artery widely patent to the bifurcation. No atherosclerotic change at the bifurcation. Cervical internal carotid artery is normal.  Vertebral arteries: Both vertebral artery origins widely patent with the left being dominant. Both vertebral arteries widely patent through the cervical region. 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 Other neck: No mass or lymphadenopathy. Upper chest: Early emphysematous change. Review of the MIP images confirms the above findings CTA HEAD FINDINGS Anterior circulation: Both internal carotid arteries widely patent through the skullbase and siphon regions. The anterior and middle cerebral vessels are patent without proximal stenosis, aneurysm or vascular malformation. Posterior circulation: Both vertebral arteries widely patent to the basilar. No basilar stenosis. Posterior circulation branch vessels are normal. Venous sinuses: Patent and normal. Anatomic variants: None significant Delayed phase: No abnormal enhancement Review of the MIP images confirms the above findings IMPRESSION: No significant finding on this study. Mild atherosclerotic disease of the aortic arch. No large or medium vessel intracranial finding. Electronically Signed   By: Paulina Fusi M.D.   On: 08/02/2016 09:37   Ct Abdomen W Contrast  Result Date: 07/19/2016 CLINICAL DATA:  Left-sided pain for 4 months. History of splenic infarct. EXAM: CT ABDOMEN WITH CONTRAST TECHNIQUE: Multidetector CT imaging of the  abdomen was performed using the standard protocol following bolus administration of intravenous contrast. CONTRAST:  ISOVUE-300 IOPAMIDOL (ISOVUE-300) INJECTION 61% COMPARISON:  05/07/2016 abdominal CT.  05/09/2016 chest CT. FINDINGS: Lower chest: Clear lung bases. Borderline cardiomegaly. Lad coronary artery atherosclerosis. Decrease in trace left pleural fluid. Hepatobiliary: Normal liver. Normal gallbladder, without biliary ductal dilatation. Pancreas: Normal, without mass or ductal dilatation. Spleen: Significant improvement in splenic infarct, which is diminutive on image 27/series 3. No perisplenic fluid.  Adrenals/Urinary Tract: Normal adrenal glands. Normal kidneys, without hydronephrosis. Stomach/Bowel: Normal stomach, without wall thickening. Colonic stool burden suggests constipation. Normal imaged terminal ileum. Normal abdominal small bowel. Vascular/Lymphatic: Aortic and branch vessel atherosclerosis. No retroperitoneal or retrocrural adenopathy. Other: No ascites. Musculoskeletal: No acute osseous abnormality. Prominent disc bulge at the lumbosacral junction. IMPRESSION: 1. Significant improvement in now diminutive splenic infarct. Decrease in trace left pleural fluid. 2.  Possible constipation. 3. Age advanced coronary artery atherosclerosis. Recommend assessment of coronary risk factors and consideration of medical therapy. 4.  Aortic atherosclerosis. Electronically Signed   By: Jeronimo Greaves M.D.   On: 07/19/2016 15:58   Mr Brain Wo Contrast  Result Date: 08/01/2016 CLINICAL DATA:  66 y/o  F; dizziness and right-sided weakness. EXAM: MRI HEAD WITHOUT CONTRAST TECHNIQUE: Multiplanar, multiecho pulse sequences of the brain and surrounding structures were obtained without intravenous contrast. COMPARISON:  08/01/2016 CT of the head. FINDINGS: Brain: 6 mm focus of diffusion restriction within the right mid corona radiata (series 4, image 16 and series 400, image 16). 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 Mild chronic microvascular ischemic changes and parenchymal volume loss of the brain. No hydrocephalus or extra-axial collection. No intracranial hemorrhage. Vascular: Normal flow voids. Skull and upper cervical spine: Normal marrow signal. Sinuses/Orbits: Right mastoid tip trace effusion. Mild ethmoid sinus mucosal thickening. Otherwise no significant abnormal signal of paranasal sinuses or mastoid air cells. Bilateral intra-ocular lens replacement. Other: None. IMPRESSION: 1. 6 mm focus of diffusion restriction within the right mid corona radiata compatible with acute/early  subacute infarction. No associated hemorrhage. 2. 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 3. Mild chronic microvascular ischemic changes and mild parenchymal volume loss of the brain. These results will be called to the ordering clinician or representative by the Radiologist Assistant, and communication documented in the PACS or zVision Dashboard. Electronically Signed   By: Mitzi Hansen M.D.   On: 08/01/2016 21:26    Microbiology: No results found for this or any previous visit (from the past 240 hour(s)).   Labs: Basic Metabolic Panel:  Recent Labs Lab 08/01/16 1431 08/01/16 2009 08/02/16 0309  NA 137  --  143  K 4.1  --  3.9  CL 104  --  106  CO2 27  --  27  GLUCOSE 285*  --  147*  BUN 31*  --  19  CREATININE 0.87  --  0.79  CALCIUM 9.0  --  9.1  MG  --  2.2  --    Liver Function Tests:  Recent Labs Lab 08/01/16 1431  AST 20  ALT 26  ALKPHOS 86  BILITOT 0.9  PROT 7.8  ALBUMIN 4.4   No results for input(s): LIPASE, AMYLASE in the last 168 hours. No results for input(s): AMMONIA in the last 168 hours. CBC:  Recent Labs Lab 08/01/16 1431 08/02/16 0309  WBC 5.2 5.6  NEUTROABS 3.4 2.3  HGB 13.0 12.8  HCT 40.0 39.1  MCV 90.5 90.9  PLT 300 288   Cardiac Enzymes:  Recent Labs Lab 08/01/16 1431  TROPONINI <0.03   BNP: BNP (last 3 results) No results for input(s): BNP in the last 8760 hours.  ProBNP (last 3 results) No results for input(s): PROBNP in the last 8760 hours.  CBG:  Recent Labs Lab 08/03/16 1658 08/03/16 2106 08/04/16 0621 08/04/16 1136 08/04/16 1629  GLUCAP 108* 190* 144* 164* 117*       Signed:  Penny Pia MD.  [facility] 08/06/2016, 9:14 AM

## 2016-08-21 ENCOUNTER — Telehealth: Payer: Self-pay | Admitting: Cardiology

## 2016-08-21 NOTE — Telephone Encounter (Signed)
Received records from [facility] @ [place] for appointment on 09/10/16 with Dr Herbie [place].  Records put with Dr Elissa Hefty schedule for 09/10/16. lp

## 2016-09-10 ENCOUNTER — Ambulatory Visit (INDEPENDENT_AMBULATORY_CARE_PROVIDER_SITE_OTHER): Payer: Commercial Managed Care - PPO | Admitting: Cardiology

## 2016-09-10 ENCOUNTER — Encounter: Payer: Self-pay | Admitting: Cardiology

## 2016-09-10 VITALS — BP 120/70 | HR 87 | Ht 63.0 in | Wt 158.0 lb

## 2016-09-10 DIAGNOSIS — I82532 Chronic embolism and thrombosis of left popliteal vein: Secondary | ICD-10-CM

## 2016-09-10 DIAGNOSIS — D735 Infarction of spleen: Secondary | ICD-10-CM | POA: Diagnosis not present

## 2016-09-10 DIAGNOSIS — I10 Essential (primary) hypertension: Secondary | ICD-10-CM

## 2016-09-10 DIAGNOSIS — I493 Ventricular premature depolarization: Secondary | ICD-10-CM

## 2016-09-10 DIAGNOSIS — I63039 Cerebral infarction due to thrombosis of unspecified carotid artery: Secondary | ICD-10-CM | POA: Diagnosis not present

## 2016-09-10 MED ORDER — BISOPROLOL FUMARATE 5 MG PO TABS: 5.0000 mg | ORAL_TABLET | Freq: Every day | ORAL | 6 refills | Status: DC

## 2016-09-10 NOTE — Progress Notes (Signed)
PCP: Sydney Gemma, PA-C  Clinic Note: Chief Complaint  Patient presents with  . 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 Cardiology evaluation    HPI: Sydney Herring is a 66 y.o. female (formally native [place], [place] who is being seen today for the evaluation of PVCs on EKG along with recent stroke and splenic infarction at the request of Sydney Herring., PA-C.  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 Johny Herring from vascular surgery who recommended compression hose and foot elevation for mild posttraumatic syndrome.  Recent Hospitalizations: Hospital notes, H&P's and discharge summaries were reviewed along with consult notes  December 18-21 2017: Admitted with splenic infarct.  She was evaluated she was evaluated with a hypercoagulable panel (seem to be relatively normal) CT as well as TEE. Per discussion with vascular surgery, she was discharged home without anticoagulation. Vascular evaluation did not find any embolic source, and TEE did not show any evidence of paradoxical embolus. -However as a result of the hospitalization, her primary care doctor put her on Xarelto.   March 15-20, 2018: Admitted with TIA then confirmed to have CVA by MRI. --> she awoke with gait imbalance and felt like she was dropping things on her right side. She was continued on Xarelto and aspirin was added. Amlodipine was held because of hypotension. In the hospital she was noted to have PVCs on telemetry. However no A. fib was noted.    she has followed up with oncology for her venous and arterial thrombosis and has also been seen by neurology in the outpatient setting.  Studies Reviewed:   TEE 04/2016:  - Left ventricle: Systolic function was normal. EF ~ 50% to 55%. Wall motion was normal; there were no regional wall motion abnormalities. - Mitral valve: There  was moderate regurgitation, with multiple  jets. - Left atrium: No evidence of thrombus in the atrial cavity or  appendage. - Right atrium: No evidence of thrombus in the atrial cavity or appendage. - Atrial septum: No defect or patent foramen ovale was identified.  2D Echo 07/2016: EF 55-60%. Nl DD. Trivial MR. Mild Aortic Sclerosis  MRI head 08/01/2016: 6 mm focus of diffusion restriction within the right mid corona radiata compatible with early/subacute infarction with no associated hemorrhage. Mild, chronic microvascular ischemic changes and mild parenchymal volume loss to the brain.  CT angiography of the head and neck 08/02/2016: Normal aortic arch with only mild atherosclerotic disease.. Normal carotid systems bilaterally. Left dominant vertebral artery with widely patent vessels.  Interval History:  Sydney Herring presents here today is asymptomatic. She has no symptoms to suggest rapid irregular heartbeats or palpitations. She says every now and then she'll feel flip-flop but nothing lasting more than a second. She plays racquetball, and swimming exercises as well as other gym exercises with no symptoms. She says she will get short of breath that she goes uphill quickly. Otherwise doing quite well. She denies any resting or exertional chest tightness or pressure. No respiratory exertional dyspnea. No syncope/near syncope or recurrent TIA/amaurosis fugax symptoms. She really doesn't notice too much the way of any significant symptoms from her recent stroke.  No chest pain or shortness of breath with rest or exertion. No PND, orthopnea or edema. No palpitations, lightheadedness, dizziness, weakness or syncope/near syncope. No TIA/amaurosis fugax symptoms. No melena, hematochezia, hematuria, or epstaxis. No  claudication.  ROS: A comprehensive was performed. Review of Systems  Constitutional: Negative for malaise/fatigue.  HENT: Negative.  Negative for nosebleeds.   Respiratory: Negative for cough,  sputum production and wheezing.   Cardiovascular: Negative.   Gastrointestinal: Negative for abdominal pain, blood in stool, melena, nausea and vomiting.  Genitourinary: Negative for flank pain and hematuria.  Musculoskeletal: Negative.   Skin: Negative.   Neurological: Negative for weakness.       Essentially recovered from stroke  Endo/Heme/Allergies: Does not bruise/bleed easily.  Psychiatric/Behavioral: Negative for depression and memory loss. The patient is not nervous/anxious and does not have insomnia.   All other systems reviewed and are negative.   Past Medical History:  Diagnosis Date  . COPD (chronic obstructive pulmonary disease) (HCC)   . Diabetes mellitus without complication (HCC)   . DVT (deep venous thrombosis) (HCC)   . Gastroesophageal reflux   . Hyperlipidemia   . Hypertension   . Peripheral vascular disease (HCC)   . 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 Vitamin D deficiency     Past Surgical History:  Procedure Laterality Date  . ELBOW SURGERY    . EYE SURGERY     bilat cataracts removed  . TEE WITHOUT CARDIOVERSION N/A 05/08/2016   Procedure: TRANSESOPHAGEAL ECHOCARDIOGRAM (TEE);  Surgeon: Sydney Cobb, MD; normal LV size EF 55%. No RWMA. No atrial or appendage thrombus. No PFO or ASD.  [name] TRANSTHORACIC ECHOCARDIOGRAM  07/2016   EF 55-60%. Nl DD. Trivial MR. Mild Aortic Sclerosis  . WISDOM TOOTH EXTRACTION      Current Meds  Medication Sig  . acetaminophen (TYLENOL) 325 MG tablet Take 650 mg by mouth every 6 (six) hours as needed for mild pain.  [name] albuterol (PROVENTIL HFA) 108 (90 Base) MCG/ACT inhaler Inhale 2 puffs into the lungs every 4 (four) hours as needed for wheezing or shortness of breath.  [name] aspirin 81 MG chewable tablet Chew 1 tablet (81 mg total) by mouth daily.  [name] atorvastatin (LIPITOR) 10 MG tablet Take 10 mg by mouth daily.  . Fluticasone-Salmeterol (ADVAIR) 250-50 MCG/DOSE AEPB Inhale 1 puff into the lungs 2 (two) times daily as needed (for  shortness of breath).   . lansoprazole (PREVACID) 15 MG capsule Take 15 mg by mouth daily.  . metFORMIN (GLUCOPHAGE-XR) 750 MG 24 hr tablet Take 750 mg by mouth every evening.   . Omega-3 1000 MG CAPS Take 1 capsule by mouth daily.  . Probiotic Product (PROBIOTIC PO) Take 1 tablet by mouth daily.   . rivaroxaban (XARELTO) 20 MG TABS tablet Take 20 mg by mouth daily.    No Known Allergies  Social History   Social History  . Marital status: Married    Spouse name: Smitty Cords  . Number of children: 0  . Years of education: 12   Occupational History  .      retired   Social History Main Topics  . Smoking status: Former Smoker    Quit date: 08/19/2015  . Smokeless tobacco: Never Used  . Alcohol use 0.0 oz/week     Comment: social  . Drug use: No  . Sexual activity: Not Asked   Other Topics Concern  . None   Social History Narrative   Lives with husband    family history includes Diabetes in her mother; Heart attack in her mother; Stroke in her mother.  Wt Readings from Last 3 Encounters:  09/12/16 159 lb (72.1 kg)  09/10/16 158 lb (71.7 kg)  08/01/16 158 lb (71.7  kg)    PHYSICAL EXAM BP 120/70   Pulse 87   Ht  (1.6 m)   Wt 158 lb (71.7 kg)   SpO2 98%   BMI 27.99 kg/m  General appearance: alert, cooperative, appears stated age, no distress and Well-nourished, well-groomed.  HEENT: [place]/AT, EOMI, MMM, anicteric sclera Neck: no adenopathy, no carotid bruit and no JVD Lungs: clear to auscultation bilaterally, normal percussion bilaterally and non-labored Heart: regular rate and rhythm, S1& S2 normal, no murmur, click, rub or gallop ; nondisplaced PMI  Abdomen: soft, non-tender; bowel sounds normal; no masses,  no organno HJR Extremities: extremities normal, atraumatic, no cyanoNoma  Pulses: 2+ and symmetric;  Skin: mobility and turgor normal, no evidence of bleeding or bruising and no lesions noted o Neurologic: Mental status: Alert & oriented 3, thought content  appropriate; Cranial nerves: normal (II-XII grossly intact)    Adult ECG Report  Rate: 87  Rhythm: normal sinus rhythm, premature ventricular contractions (PVC) and Otherwise normal axis, intervals and durations.;   Narrative Interpretation:  monomorphic PVCs single and in a couple    Other studies Reviewed: Additional studies/ records that were reviewed today include:  Recent Labs:   Lab Results  Component Value Date   CHOL 129 08/02/2016   HDL 40 (L) 08/02/2016   LDLCALC 57 08/02/2016   TRIG 161 (H) 08/02/2016   CHOLHDL 3.2 08/02/2016   Lab Results  Component Value Date   CREATININE 0.79 08/02/2016   BUN 19 08/02/2016   NA 143 08/02/2016   K 3.9 08/02/2016   CL 106 08/02/2016   CO2 27 08/02/2016    ASSESSMENT / PLAN: Problem List Items Addressed This Visit    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 She has now had 3 separate events that are quite different including a DVT directly unprovoked although she says it may have been post injury followed by splenic infarct and now stroke with no obvious cardioembolic source.  For now I would agree with continuing Xarelto until we know is going on. Interestingly, she had a stroke while on Xarelto so aspirin was added along with atorvastatin.      Relevant Medications   bisoprolol (ZEBETA) 5 MG tablet   HTN (hypertension)    Well-controlled. Not currently on any medicines.      Relevant Medications   bisoprolol (ZEBETA) 5 MG tablet   Other Relevant Orders   EKG 12-Lead (Completed)   PVC (premature ventricular contraction) - Primary    PVCs and of themselves are not overly concerning especially with a person who has had an essentially normal 2-D echocardiogram. No active symptoms of the angina or significant shortness of breath. If she were to be symptomatic and her shortness of breath on exertion got  worse, we can consider a stress test to evaluate for ischemia, however the absence of symptoms, I would be reluctant to do so.  In order to exclude atrial fibrillation as an embolic source and to determine her true PVC burden, we are then have her wear a 30 day event monitor. For the first 2 weeks she will continue as she currently is, and she will start 5 mg bisoprolol in order to determine her PVC burden on off beta blocker.      Relevant Medications   bisoprolol (ZEBETA) 5 MG tablet   Other Relevant Orders   EKG 12-Lead (  Completed)   Cardiac event monitor   Splenic infarct    The  presence of splenic infarct is concerning especially this predated her stroke and was the reason for starting Xarelto. I think we need to exclude A. fib. If we don't find anything a 30 day event monitor, and we still are thinking about an occult stroke we may want to consider loop recorder I'll defer that to neurology based on the level of suspicion.      Stroke (cerebrum) [facility])    Confusing presentation as far as no obvious source. Likely an occult stroke. She had carotid CT angiogram of the neck. These are harder to follow long-term the carotid duplex, so we will get baseline carotid duplex and then this can be used for future follow-up. Agree for now would continue aspirin and Xarelto.      Relevant Medications   bisoprolol (ZEBETA) 5 MG tablet   Other Relevant Orders   VAS US CAROTID   Cardiac event monitor      Current medicines are reviewed at length with the patient today. (+/- concerns) n/a The following changes have been made: n/a  Patient Instructions   CHANGE WITH MEDICATIONS -START BISOPROLOL 5 MG AFTER YOU HAVE BEEN WEARING THE MONITOR FOR 2 WEEKS    SCHEDULE AT [address] Your physician has requested that you have a carotid duplex. This test is an ultrasound of the carotid arteries in your neck. It looks at blood flow through these arteries that supply the brain  with blood. Allow one hour for this exam. There are no restrictions or special instructions.    SCHEDULE AT [address] Your physician has recommended that you wear an event monitor 30 DAYS. Event monitors are medical devices that record the heart's electrical activity. Doctors most often [place] these monitors to diagnose arrhythmias. Arrhythmias are problems with the speed or rhythm of the heartbeat. The monitor is a small, portable device. You can wear one while you do your normal daily activities. This is usually used to diagnose what is causing palpitations/syncope (passing out).  Your physician recommends that you schedule a follow-up appointment in 2 MONTHS WITH DR Herbie Baltimore - F/U RESULTS     Studies Ordered:   Orders Placed This Encounter  Procedures  . Cardiac event monitor  . EKG 12-Lead      Bryan Lemma, M.D., M.S. Interventional Cardiologist   Pager # [phone] Phone # [phone] [address] [place], [place] [address]

## 2016-09-10 NOTE — Patient Instructions (Addendum)
CHANGE WITH MEDICATIONS -START BISOPROLOL 5 MG AFTER YOU HAVE BEEN WEARING THE MONITOR FOR 2 WEEKS    SCHEDULE AT [address] Your physician has requested that you have a carotid duplex. This test is an ultrasound of the carotid arteries in your neck. It looks at blood flow through these arteries that supply the brain with blood. Allow one hour for this exam. There are no restrictions or special instructions.    SCHEDULE AT [address] Your physician has recommended that you wear an event monitor 30 DAYS. Event monitors are medical devices that record the heart's electrical activity. Doctors most often [place] these monitors to diagnose arrhythmias. Arrhythmias are problems with the speed or rhythm of the heartbeat. The monitor is a small, portable device. You can wear one while you do your normal daily activities. This is usually used to diagnose what is causing palpitations/syncope (passing out).  Your physician recommends that you schedule a follow-up appointment in 2 MONTHS WITH DR [facility] - F/U RESULTS

## 2016-09-12 ENCOUNTER — Encounter: Payer: Self-pay | Admitting: Cardiology

## 2016-09-12 ENCOUNTER — Ambulatory Visit (INDEPENDENT_AMBULATORY_CARE_PROVIDER_SITE_OTHER): Payer: Commercial Managed Care - PPO | Admitting: Nurse Practitioner

## 2016-09-12 ENCOUNTER — Encounter: Payer: Self-pay | Admitting: Nurse Practitioner

## 2016-09-12 VITALS — BP 125/86 | HR 89 | Ht 63.0 in | Wt 159.0 lb

## 2016-09-12 DIAGNOSIS — I10 Essential (primary) hypertension: Secondary | ICD-10-CM | POA: Diagnosis not present

## 2016-09-12 DIAGNOSIS — E7849 Other hyperlipidemia: Secondary | ICD-10-CM

## 2016-09-12 DIAGNOSIS — I63039 Cerebral infarction due to thrombosis of unspecified carotid artery: Secondary | ICD-10-CM | POA: Diagnosis not present

## 2016-09-12 DIAGNOSIS — I639 Cerebral infarction, unspecified: Secondary | ICD-10-CM

## 2016-09-12 DIAGNOSIS — I82532 Chronic embolism and thrombosis of left popliteal vein: Secondary | ICD-10-CM | POA: Diagnosis not present

## 2016-09-12 DIAGNOSIS — E784 Other hyperlipidemia: Secondary | ICD-10-CM | POA: Diagnosis not present

## 2016-09-12 NOTE — Assessment & Plan Note (Signed)
The  presence of splenic infarct is concerning especially this predated her stroke and was the reason for starting Xarelto. I think we need to exclude A. fib. If we don't find anything a 30 day event monitor, and we still are thinking about an occult stroke we may want to consider loop recorder I'll defer that to neurology based on the level of suspicion.

## 2016-09-12 NOTE — Patient Instructions (Addendum)
Stressed the importance of management of risk factors to prevent further stroke Continue Xarelto and aspirin for secondary stroke prevention and DVT prophylaxis Maintain strict control of hypertension with blood pressure goal below 130/90, today's reading125/86  continue antihypertensive medications Control of diabetes with hemoglobin A1c below 6.5 followed by primary care most recent hemoglobin A1c 7.8 continue diabetic medications Cholesterol with LDL cholesterol less than 70, followed by primary care,  most recent 57 continue statin drugs Lipitor Exercise by walking, bowling and  eat healthy diet with whole grains,  fresh fruits and vegetables To be set up for cardiac monitoring and carotid Doppler by cardiology next week Follow-up in 6 months

## 2016-09-12 NOTE — Assessment & Plan Note (Signed)
Confusing presentation as far as no obvious source. Likely an occult stroke. She had carotid CT angiogram of the neck. These are harder to follow long-term the carotid duplex, so we will get baseline carotid duplex and then this can be used for future follow-up. Agree for now would continue aspirin and Xarelto.

## 2016-09-12 NOTE — Assessment & Plan Note (Signed)
Well-controlled. Not currently on any medicines.

## 2016-09-12 NOTE — Progress Notes (Signed)
PhysicalDebrox work regarding multiple  [facility]  PATIENT: Sydney Herring DOB: 16-Jan-1951   REASON FOR VISIT: Hospital follow-up for stroke  HISTORY FROM: Patient    HISTORY OF PRESENT ILLNESS: [name], 66 year old female returns for hospital follow-up. She was admitted on 08/01/2016 after waking up with a gait imbalance the day before and dropping things out of the right side morning she was admitted for dizziness and weakness on the left side. She has been on Xarelto for DVT prophylaxis. She has a medical history of COPD, diabetes hyperlipidemia hypertension and peripheral vascular disease and vitamin D deficiency. CTA head and neck without significant finding on the study mild atherosclerotic disease of the aortic arch no large or medium vessel intracranial finding. MRI of the brain 1.6 mm focus of diffusion restriction within the right mid corona radiata comparable with acute subacute infarction. No associated hemorrhage. 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 Mild chronic microvascular ischemic changes. Aspirin was added to her  Xarelto. 2-D echo EF 55-60% On follow-up visit to the clinic today she has not had further stroke or TIA symptoms. She remains on Xarelto and aspirin without significant bruising or bleeding. She remains on Lipitor for hyperlipidemia without complaints of myalgias. LDL 57 triglycerides were elevated at 161. Hemoglobin A1c 7.8. She remains on her diabetic medications. 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 She is to have a 30 day cardiac event monitor placed next week along with a carotid Doppler study. She returns for reevaluation   REVIEW OF SYSTEMS: Full 14 system review of systems performed and notable only for those listed, all others are neg:  Constitutional: neg  Cardiovascular: neg Ear/Nose/Throat: neg  Skin: neg Eyes: neg Respiratory: neg Gastroitestinal: neg  Hematology/Lymphatic: neg    Endocrine: Feeling hot or cold Musculoskeletal:neg Allergy/Immunology: Seasonal allergies Neurological: neg Psychiatric: neg Sleep : neg   ALLERGIES: No Known Allergies  HOME MEDICATIONS: Outpatient Medications Prior to Visit  Medication Sig Dispense Refill  . acetaminophen (TYLENOL) 325 MG tablet Take 650 mg by mouth every 6 (six) hours as needed for mild pain.    [name] albuterol (PROVENTIL HFA) 108 (90 Base) MCG/ACT inhaler Inhale 2 puffs into the lungs every 4 (four) hours as needed for wheezing or shortness of breath.    [name] aspirin 81 MG chewable tablet Chew 1 tablet (81 mg total) by mouth daily. 30 tablet 0  . atorvastatin (LIPITOR) 10 MG tablet Take 10 mg by mouth daily.    . bisoprolol (ZEBETA) 5 MG tablet Take 1 tablet (5 mg total) by mouth daily. 30 tablet 6  . Fluticasone-Salmeterol (ADVAIR) 250-50 MCG/DOSE AEPB Inhale 1 puff into the lungs 2 (two) times daily as needed (for shortness of breath).     . lansoprazole (PREVACID) 15 MG capsule Take 15 mg by mouth daily.    . metFORMIN (GLUCOPHAGE-XR) 750 MG 24 hr tablet Take 750 mg by mouth every evening.     . Omega-3 1000 MG CAPS Take 1 capsule by mouth daily.    . Probiotic Product (PROBIOTIC PO) Take 1 tablet by mouth daily.     . rivaroxaban (XARELTO) 20 MG TABS tablet Take 20 mg by mouth daily.     No facility-administered medications prior to visit.     PAST MEDICAL HISTORY: Past Medical History:  Diagnosis Date  . COPD (chronic obstructive pulmonary disease) (HCC)   . Diabetes mellitus without complication (HCC)   . DVT (deep venous thrombosis) (  HCC)   . Gastroesophageal reflux   . Hyperlipidemia   . Hypertension   . Peripheral vascular disease (HCC)   . 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 Vitamin D deficiency     PAST SURGICAL HISTORY: Past Surgical History:  Procedure Laterality Date  . ELBOW SURGERY    . EYE SURGERY     bilat cataracts removed  . TEE WITHOUT CARDIOVERSION N/A 05/08/2016   Procedure:  TRANSESOPHAGEAL ECHOCARDIOGRAM (TEE);  Surgeon: Orpah Cobb, MD;  Location: [facility];  Service: Cardiovascular;  Laterality: N/A;  . WISDOM TOOTH EXTRACTION      FAMILY HISTORY: Family History  Problem Relation Age of Onset  . Diabetes Mother   . Stroke Mother   . Heart attack Mother     SOCIAL HISTORY: Social History   Social History  . Marital status: Married    Spouse name: Smitty Cords  . Number of children: 0  . Years of education: 12   Occupational History  .      retired   Social History Main Topics  . Smoking status: Former Smoker    Quit date: 08/19/2015  . Smokeless tobacco: Never Used  . Alcohol use 0.0 oz/week     Comment: social  . Drug use: No  . Sexual activity: Not on file   Other Topics Concern  . Not on file   Social History Narrative   Lives with husband     PHYSICAL EXAM  Vitals:   09/12/16 1428  BP: 125/86  Pulse: 89  Weight: 159 lb (72.1 kg)  Height:  (1.6 m)   Body mass index is 28.17 kg/m.  Generalized: Well developed, in no acute distress  Head: normocephalic and atraumatic,. Oropharynx benign  Neck: Supple, no carotid bruits  Cardiac: Regular rate rhythm, no murmur  Musculoskeletal: No deformity   Neurological examination   Mentation: Alert oriented to time, place, history taking. Attention span and concentration appropriate. Recent and remote memory intact.  Follows all commands speech and language fluent.   Cranial nerve II-XII: Fundoscopic exam reveals sharp disc margins.Pupils were equal round reactive to light extraocular movements were full, visual field were full on confrontational test. Facial sensation and strength were normal. hearing was intact to finger rubbing bilaterally. Uvula tongue midline. head turning and shoulder shrug were normal and symmetric.Tongue protrusion into cheek strength was normal. Motor: normal bulk and tone, full strength in the BUE, BLE, fine finger movements normal, no pronator drift. No  focal weakness Sensory: normal and symmetric to light touch, pinprick, and  Vibration, in the upper and lower extremities Coordination: finger-nose-finger, heel-to-shin bilaterally, no dysmetria Reflexes: Brachioradialis 2/2, biceps 2/2, triceps 2/2, patellar 2/2, Achilles 2/2, plantar responses were flexor bilaterally. Gait and Station: Rising up from seated position without assistance, normal stance,  moderate stride, good arm swing, smooth turning, able to perform tiptoe, and heel walking without difficulty. Tandem gait is steady  DIAGNOSTIC DATA (LABS, IMAGING, TESTING) - I reviewed patient records, labs, notes, testing and imaging myself where available.  Lab Results  Component Value Date   WBC 5.6 08/02/2016   HGB 12.8 08/02/2016   HCT 39.1 08/02/2016   MCV 90.9 08/02/2016   PLT 288 08/02/2016      Component Value Date/Time   NA 143 08/02/2016 0309   K 3.9 08/02/2016 0309   CL 106 08/02/2016 0309   CO2 27 08/02/2016 0309   GLUCOSE 147 (H) 08/02/2016 0309   BUN 19 08/02/2016 0309   CREATININE 0.79 08/02/2016 0309  CALCIUM 9.1 08/02/2016 0309   PROT 7.8 08/01/2016 1431   ALBUMIN 4.4 08/01/2016 1431   AST 20 08/01/2016 1431   ALT 26 08/01/2016 1431   ALKPHOS 86 08/01/2016 1431   BILITOT 0.9 08/01/2016 1431   GFRNONAA >60 08/02/2016 0309   GFRAA >60 08/02/2016 0309   Lab Results  Component Value Date   CHOL 129 08/02/2016   HDL 40 (L) 08/02/2016   LDLCALC 57 08/02/2016   TRIG 161 (H) 08/02/2016   CHOLHDL 3.2 08/02/2016   Lab Results  Component Value Date   HGBA1C 7.8 (H) 08/02/2016    Lab Results  Component Value Date   TSH 1.326 08/01/2016      ASSESSMENT AND PLAN  66 y.o. year old female  has a past medical history of COPD (chronic obstructive pulmonary disease) (HCC); Diabetes mellitus without complication (HCC); DVT (deep venous thrombosis) (HCC); Gastroesophageal reflux; Hyperlipidemia; Hypertension; Peripheral vascular disease (HCC); Splenic infarct  (04/2016); and Vitamin D deficiency here for hospital follow-up for stroke.Marland KitchenMRI of the brain 1.6 mm focus of diffusion restriction within the right mid corona radiata comparable with acute subacute infarction. No associated hemorrhage. 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 Mild chronic microvascular ischemic changes.   PLAN: Stressed the importance of management of risk factors to prevent further stroke Continue Xarelto and aspirin for secondary stroke prevention and DVT prophylaxis Maintain strict control of hypertension with blood pressure goal below 130/90, today's reading125/86  continue antihypertensive medications Control of diabetes with hemoglobin A1c below 6.5 followed by primary care most recent hemoglobin A1c 7.8 continue diabetic medications Cholesterol with LDL cholesterol less than 70, followed by primary care,  most recent 57 continue statin drugs Lipitor Exercise by walking, bowling and  eat healthy diet with whole grains,  fresh fruits and vegetables To be set up for cardiac monitoring and carotid Doppler by cardiology next week Follow-up in 6 months  Discussed risk for recurrent stroke/ TIA and answered additional questions This was a prolonged visit requiring and medical decision making of high complexity with extensive review of history, hospital chart, counseling and answering questions Nilda Riggs, [facility], [facility], APRN  [facility] [address] [place], [place] [address] [phone]

## 2016-09-12 NOTE — Assessment & Plan Note (Signed)
Very difficult to tell whether she should or should not be on anticoagulation. She has now had 3 separate events that are quite different including a DVT directly unprovoked although she says it may have been post injury followed by splenic infarct and now stroke with no obvious cardioembolic source.  For now I would agree with continuing Xarelto until we know is going on. Interestingly, she had a stroke while on Xarelto so aspirin was added along with atorvastatin.

## 2016-09-12 NOTE — Assessment & Plan Note (Addendum)
PVCs and of themselves are not overly concerning especially with a person who has had an essentially normal 2-D echocardiogram. No active symptoms of the angina or significant shortness of breath. If she were to be symptomatic and her shortness of breath on exertion got worse, we can consider a stress test to evaluate for ischemia, however the absence of symptoms, I would be reluctant to do so.  In order to exclude atrial fibrillation as an embolic source and to determine her true PVC burden, we are then have her wear a 30 day event monitor. For the first 2 weeks she will continue as she currently is, and she will start 5 mg bisoprolol in order to determine her PVC burden on off beta blocker.

## 2016-09-14 NOTE — Progress Notes (Signed)
I agree with the above plan 

## 2016-09-18 ENCOUNTER — Ambulatory Visit (HOSPITAL_COMMUNITY)
Admission: RE | Admit: 2016-09-18 | Discharge: 2016-09-18 | Disposition: A | Discharge status: Home / Self Care | Payer: Commercial Managed Care - PPO | Source: Ambulatory Visit | Attending: Cardiology | Admitting: Cardiology

## 2016-09-18 ENCOUNTER — Ambulatory Visit (INDEPENDENT_AMBULATORY_CARE_PROVIDER_SITE_OTHER): Payer: Commercial Managed Care - PPO

## 2016-09-18 ENCOUNTER — Other Ambulatory Visit: Payer: Self-pay | Admitting: Cardiology

## 2016-09-18 DIAGNOSIS — I639 Cerebral infarction, unspecified: Secondary | ICD-10-CM | POA: Diagnosis not present

## 2016-09-18 DIAGNOSIS — I4891 Unspecified atrial fibrillation: Secondary | ICD-10-CM | POA: Diagnosis not present

## 2016-09-18 DIAGNOSIS — I63039 Cerebral infarction due to thrombosis of unspecified carotid artery: Secondary | ICD-10-CM | POA: Diagnosis not present

## 2016-09-18 DIAGNOSIS — I493 Ventricular premature depolarization: Secondary | ICD-10-CM

## 2016-09-18 DIAGNOSIS — I6523 Occlusion and stenosis of bilateral carotid arteries: Secondary | ICD-10-CM | POA: Diagnosis not present

## 2016-09-25 ENCOUNTER — Telehealth: Payer: Self-pay | Admitting: *Deleted

## 2016-09-25 NOTE — Telephone Encounter (Signed)
LEFT MESSAGE TO CALL BACK- CAROTID  RESULTS

## 2016-09-25 NOTE — Telephone Encounter (Signed)
Returned call and discussed results, recommended pt continue to wear 30 day event monitor for further investigation of her symptoms. Pt verbalized understanding and thanks. Aware to call if new concerns or questions.

## 2016-09-25 NOTE — Telephone Encounter (Signed)
New Message ° ° pt verbalized that she is returning call for rn °

## 2016-09-25 NOTE — Telephone Encounter (Signed)
-----   Message from Marykay Lexavid W Harding, MD sent at 09/24/2016  6:07 PM EDT ----- Carotid artery Doppler results show fairly normal findings. Normal vertebral and subclavian arteries. Mild carotid stenosis bilaterally. No need to follow-up routinely.  Bryan Lemmaavid Harding, MD  Please forward to  Mady GemmaKristen Kaplan, PA-C.

## 2016-10-16 ENCOUNTER — Ambulatory Visit: Payer: Self-pay | Admitting: Neurology

## 2016-11-22 ENCOUNTER — Encounter: Payer: Self-pay | Admitting: Cardiology

## 2016-11-22 ENCOUNTER — Ambulatory Visit (INDEPENDENT_AMBULATORY_CARE_PROVIDER_SITE_OTHER): Payer: Commercial Managed Care - PPO | Admitting: Cardiology

## 2016-11-22 VITALS — BP 158/91 | HR 91 | Ht 63.0 in | Wt 159.6 lb

## 2016-11-22 DIAGNOSIS — F17201 Nicotine dependence, unspecified, in remission: Secondary | ICD-10-CM

## 2016-11-22 DIAGNOSIS — I639 Cerebral infarction, unspecified: Secondary | ICD-10-CM

## 2016-11-22 DIAGNOSIS — R0609 Other forms of dyspnea: Secondary | ICD-10-CM

## 2016-11-22 DIAGNOSIS — E669 Obesity, unspecified: Secondary | ICD-10-CM | POA: Diagnosis not present

## 2016-11-22 DIAGNOSIS — E1169 Type 2 diabetes mellitus with other specified complication: Secondary | ICD-10-CM | POA: Diagnosis not present

## 2016-11-22 DIAGNOSIS — E118 Type 2 diabetes mellitus with unspecified complications: Secondary | ICD-10-CM

## 2016-11-22 DIAGNOSIS — E7849 Other hyperlipidemia: Secondary | ICD-10-CM

## 2016-11-22 DIAGNOSIS — E784 Other hyperlipidemia: Secondary | ICD-10-CM | POA: Diagnosis not present

## 2016-11-22 DIAGNOSIS — I493 Ventricular premature depolarization: Secondary | ICD-10-CM

## 2016-11-22 DIAGNOSIS — I10 Essential (primary) hypertension: Secondary | ICD-10-CM | POA: Diagnosis not present

## 2016-11-22 MED ORDER — BISOPROLOL FUMARATE 10 MG PO TABS: 10.0000 mg | ORAL_TABLET | Freq: Every day | ORAL | 3 refills | Status: DC

## 2016-11-22 NOTE — Assessment & Plan Note (Addendum)
With the amount of PVCs & multifocal - concern for coronary artery disease. She does have some exertional dyspnea as well as family history of CAD.  Plan: Coronary CTA-FFR (vs. Myoview if coronary CTA not approved) Increase BB to 10mg 

## 2016-11-22 NOTE — Patient Instructions (Signed)
MEDICATION CHANGE  INCREASE BISOPROLOL TO 10 MG ONE TABLET DAILY     SCHEDULE AT [address] Your physician has requested that you have cardiac CT. Cardiac computed tomography (CT) is a painless test that uses an x-ray machine to take clear, detailed pictures of your heart. For further information please visit [email or web address] Please follow instruction sheet as given.     Your physician recommends that you schedule a follow-up appointment in 2 MONTHS WITH DR HARDING.   If you need a refill on your cardiac medications before your next appointment, please call your pharmacy.

## 2016-11-22 NOTE — Progress Notes (Signed)
PCP: Sydney Herring  Clinic Note: Chief Complaint  Patient presents with  . Follow-up    patient has no new concerns; following up monitor for PVCs    HPI: Sydney Herring is a 66 y.o. female who is being seen today for Follow-up evaluation of frequent PVCs at the request of Sydney Herring.  Sydney Herring was initially seen on April 24. He was noted that she has had a recent stroke with a relatively normal echocardiogram and TEE performed. There is concern about frequent PVCs noted on clinic visit EKG. We had her wear a monitor with results noted below.  Recent Hospitalizations: None since last visit  Studies Personally Reviewed - (if available, images/films reviewed: From Epic Chart or Care Everywhere)  Carotid Dopplers: Relatively normal. Normal vertebral and subclavian arteries. Mild carotid disease.  Cardiac Event Monitor: 09/18/2016 - 10/17/2016. Overall mostly signal sinus rhythm with average rate of 82 BPM. Minimum rate 47 with sinus bradycardia and 128 with sinus tachycardia.  Frequent PVCs, and trigeminy as well as couplets/triplets and short runs noted. At least 2 morphologies of PVCs noted.  No true arrhythmia noted.  1 episode with a "2 second pause which appears to be a blocked PAC noted  Unfortunately, the diary was not maintained and therefore we are unable to assess symptoms with findings  Interval History: Sydney Herring presents today really stating that she is not on that symptomatic with the premature beats that were noted on the monitor. She didn't really notice any significant palpitations.  She has not had any problems with chest pain or pressure with rest or exertion, but does note that she's been having some exertional dyspnea, especially if she is trying to do more vigorous activity or even things like climbing inhaler taking a flight of steps. She has a little bit of intermittent residual weakness on left side, but is not limited by that. What she  does notice that the left leg has more swelling than usual that does go down at night with elevation. No PND, orthopnea or edema. No palpitations (does not feel PVCs), lightheadedness, dizziness, weakness or syncope/near syncope. No TIA/amaurosis fugax symptoms. No melena, hematochezia, hematuria, or epstaxis. No claudication.  ROS: A comprehensive was performed. Review of Systems  Constitutional: Negative for malaise/fatigue.  HENT: Negative.   Respiratory: Negative.   Gastrointestinal: Negative.   Genitourinary: Negative.   Musculoskeletal: Negative.   Neurological: Positive for focal weakness (Occasional left leg weakness).  Psychiatric/Behavioral: Negative for memory loss. The patient is not nervous/anxious and does not have insomnia.   All other systems reviewed and are negative.   I have reviewed and (if needed) personally updated the patient's problem list, medications, allergies, past medical and surgical history, social and family history.   Past Medical History:  Diagnosis Date  . COPD (chronic obstructive pulmonary disease) (HCC)   . CVA (cerebral vascular accident) (HCC) 07/2016  . Diabetes mellitus without complication (HCC)   . DVT (deep venous thrombosis) (HCC)   . Gastroesophageal reflux   . Hyperlipidemia   . Hypertension   . Peripheral vascular disease (HCC)   . 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 Vitamin D deficiency     Past Surgical History:  Procedure Laterality Date  . ELBOW SURGERY    . EYE SURGERY     bilat cataracts removed  . TEE WITHOUT CARDIOVERSION N/A 05/08/2016   Procedure: TRANSESOPHAGEAL ECHOCARDIOGRAM (TEE);  Surgeon: Orpah CobbAjay Kadakia, MD; normal LV size EF 55%. No RWMA. No atrial or  appendage thrombus. No PFO or ASD.  [name] TRANSTHORACIC ECHOCARDIOGRAM  07/2016   EF 55-60%. Nl DD. Trivial MR. Mild Aortic Sclerosis  . WISDOM TOOTH EXTRACTION      Current Meds  Medication Sig  . acetaminophen (TYLENOL) 325 MG tablet Take 650 mg by mouth every 6 (six)  hours as needed for mild pain.  [name] albuterol (PROVENTIL HFA) 108 (90 Base) MCG/ACT inhaler Inhale 2 puffs into the lungs every 4 (four) hours as needed for wheezing or shortness of breath.  [name] aspirin 81 MG chewable tablet Chew 1 tablet (81 mg total) by mouth daily.  [name] atorvastatin (LIPITOR) 10 MG tablet Take 10 mg by mouth daily.  . Fluticasone-Salmeterol (ADVAIR) 250-50 MCG/DOSE AEPB Inhale 1 puff into the lungs 2 (two) times daily as needed (for shortness of breath).   . lansoprazole (PREVACID) 15 MG capsule Take 15 mg by mouth daily.  . metFORMIN (GLUCOPHAGE-XR) 750 MG 24 hr tablet Take 750 mg by mouth every evening.   . Omega-3 1000 MG CAPS Take 1 capsule by mouth daily.  . Probiotic Product (PROBIOTIC PO) Take 1 tablet by mouth daily.   . rivaroxaban (XARELTO) 20 MG TABS tablet Take 20 mg by mouth daily.  . [DISCONTINUED] bisoprolol (ZEBETA) 5 MG tablet Take 1 tablet (5 mg total) by mouth daily.    No Known Allergies  Social History   Social History  . Marital status: Married    Spouse name: Sydney Herring  . Number of children: 0  . Years of education: 12   Occupational History  .      retired   Social History Main Topics  . Smoking status: Former Smoker    Quit date: 08/19/2015  . Smokeless tobacco: Never Used  . Alcohol use 0.0 oz/week     Comment: social  . Drug use: No  . Sexual activity: Not Asked   Other Topics Concern  . None   Social History Narrative   Lives with husband    family history includes Diabetes in her mother; Heart attack in her mother; Stroke in her mother.  Wt Readings from Last 3 Encounters:  11/22/16 159 lb 9.6 oz (72.4 kg)  09/12/16 159 lb (72.1 kg)  09/10/16 158 lb (71.7 kg)    PHYSICAL EXAM BP (!) 158/91   Pulse 91   Ht 5\' 3"  (1.6 m)   Wt 159 lb 9.6 oz (72.4 kg)   BMI 28.27 kg/m  General appearance: alert, cooperative, appears stated age, no distress.Well-nourished, well-groomed. HEENT: [place]/AT, EOMI, MMM, anicteric sclera Neck: no  adenopathy, no carotid bruit and no JVD Lungs: clear to auscultation bilaterally, normal percussion bilaterally and non-labored Heart: regular rate and rhythm, S1 &S2 normal, no murmur, click, rub or gallop; nondisplaced PMI Abdomen: soft, non-tender; bowel sounds normal; no masses,  no organomegaly; no HJR Extremities: extremities normal, atraumatic, no cyanosis, or edema  Pulses: 2+ and symmetric;  Skin: normal, mobility and turgor normal, no edema and no evidence of bleeding or bruising Neurologic: Mental status: Alert & oriented x 3, thought content appropriate; non-focal exam.  Pleasant mood & affect.   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 Currently on metformin      Essential hypertension (Chronic)    Pressure up today. In: Increased beta blocker dose to 10 mg daily. Monitor closely in follow-up.      Relevant Medications   bisoprolol (ZEBETA) 10 MG tablet   Exertional dyspnea    This was not a symptom she noted TIMI I first saw her, she was very forthcoming with symptoms. The concern with the amount of PVCs that she's having and having risk factors of hypertension, hyperlipidemia and diabetes with family history. Would like to get a baseline ischemic evaluation and think that perhaps an anatomic with potentially physiologic evaluation using coronary CTA with the potential for CT  FFR is about option exclude possible left main disease and avoid the false positive/negative potential of a nuclear stress test.  Plan: Coronary CTA-CT FFR      Relevant Orders   CT CORONARY MORPH W/CTA COR W/SCORE W/CA W/CM &/OR WO/CM   CT CORONARY FRACTIONAL FLOW RESERVE DATA PREP   CT CORONARY FRACTIONAL FLOW RESERVE FLUID ANALYSIS   Frequent multifocal PVCs - Primary (Chronic)    With the amount of PVCs & multifocal - concern for coronary artery disease. She does have some exertional dyspnea as well as family history of CAD.  Plan: Coronary CTA-FFR (vs. Myoview if coronary CTA not approved) Increase BB to 10mg        Relevant Medications   bisoprolol (ZEBETA) 10 MG tablet   Other Relevant Orders   CT CORONARY MORPH W/CTA COR W/SCORE W/CA W/CM &/OR WO/CM   CT CORONARY FRACTIONAL FLOW RESERVE DATA PREP   CT CORONARY FRACTIONAL FLOW RESERVE FLUID ANALYSIS   HLD (hyperlipidemia) (Chronic)    Fairly well-controlled lipids on current dose of statin.      Relevant Medications   bisoprolol (ZEBETA) 10 MG tablet   Tobacco abuse, in remission (Chronic)    Yet another risk factor for coronary disease recently desired to evaluate for presence of coronary disease as well as potential ischemic disease with a coronary CTA.         Current medicines are reviewed at length with the patient today. (+/- concerns) n/a The following changes have been made: see below.  Patient Instructions  MEDICATION CHANGE  INCREASE BISOPROLOL TO 10 MG ONE TABLET DAILY     SCHEDULE AT [address] Your physician has requested that you have cardiac CT. Cardiac computed tomography (CT) is a painless test that uses an x-ray machine to take clear, detailed pictures of your heart. For further information please visit [email or web address] Please follow instruction sheet as given.     Your physician recommends that you schedule a follow-up appointment in 2 MONTHS WITH DR  HARDING.   If you need a refill on your cardiac medications before your next appointment, please call your pharmacy.     Studies Ordered:   Orders Placed This Encounter  Procedures  . CT CORONARY MORPH W/CTA COR W/SCORE W/CA W/CM &/OR WO/CM  . CT CORONARY FRACTIONAL FLOW RESERVE DATA PREP  . CT CORONARY FRACTIONAL FLOW RESERVE FLUID ANALYSIS      Bryan Lemma, M.D.,  M.S. Interventional Cardiologist   Pager # [phone] Phone # [phone] [address] [place], [place] [address]

## 2016-11-24 ENCOUNTER — Encounter: Payer: Self-pay | Admitting: Cardiology

## 2016-11-24 NOTE — Assessment & Plan Note (Signed)
Yet another risk factor for coronary disease recently desired to evaluate for presence of coronary disease as well as potential ischemic disease with a coronary CTA.

## 2016-11-24 NOTE — Assessment & Plan Note (Addendum)
This was not a symptom she noted TIMI I first saw her, she was very forthcoming with symptoms. The concern with the amount of PVCs that she's having and having risk factors of hypertension, hyperlipidemia and diabetes with family history. Would like to get a baseline ischemic evaluation and think that perhaps an anatomic with potentially physiologic evaluation using coronary CTA with the potential for CT FFR is about option exclude possible left main disease and avoid the false positive/negative potential of a nuclear stress test.  Plan: Coronary CTA-CT FFR

## 2016-11-24 NOTE — Assessment & Plan Note (Signed)
Pressure up today. In: Increased beta blocker dose to 10 mg daily. Monitor closely in follow-up.

## 2016-11-24 NOTE — Assessment & Plan Note (Signed)
Fairly well-controlled lipids on current dose of statin.

## 2016-11-24 NOTE — Assessment & Plan Note (Signed)
Managed by PCP. Currently on metformin

## 2016-11-25 ENCOUNTER — Telehealth: Payer: Self-pay | Admitting: Nurse Practitioner

## 2016-11-25 NOTE — Telephone Encounter (Signed)
Patient calling to get a letter written to her dentist, Lemont FillersMandi Flynn so her teeth can be cleaned. Please mail to the patient.

## 2016-11-26 ENCOUNTER — Encounter: Payer: Self-pay | Admitting: *Deleted

## 2016-11-26 NOTE — Telephone Encounter (Signed)
Spoke with patient and advised her she may have her teeth cleaned, no need to stop Asprin or Xarelto. Patient stated her dentist wants a letter. Patient requested the letter be mailed to her. This RN advised the letter will be ready today. Patient verbalized understanding, appreciation.

## 2016-11-26 NOTE — Telephone Encounter (Signed)
There should be no contraindication to having her teeth cleaned with her stroke. She would not need to stop the Xarelto or the aspirin

## 2016-12-03 ENCOUNTER — Encounter: Payer: Self-pay | Admitting: Cardiology

## 2016-12-06 ENCOUNTER — Other Ambulatory Visit (HOSPITAL_BASED_OUTPATIENT_CLINIC_OR_DEPARTMENT_OTHER): Payer: Commercial Managed Care - PPO

## 2016-12-06 ENCOUNTER — Ambulatory Visit (HOSPITAL_BASED_OUTPATIENT_CLINIC_OR_DEPARTMENT_OTHER): Payer: Commercial Managed Care - PPO | Admitting: Oncology

## 2016-12-06 VITALS — BP 129/67 | HR 70 | Temp 98.1°F | Resp 19 | Ht 63.0 in | Wt 161.6 lb

## 2016-12-06 DIAGNOSIS — I639 Cerebral infarction, unspecified: Secondary | ICD-10-CM

## 2016-12-06 DIAGNOSIS — I82532 Chronic embolism and thrombosis of left popliteal vein: Secondary | ICD-10-CM | POA: Diagnosis not present

## 2016-12-06 LAB — CBC WITH DIFFERENTIAL/PLATELET
BASO%: 0.6 % (ref 0.0–2.0)
Basophils Absolute: 0 10*3/uL (ref 0.0–0.1)
EOS%: 1.3 % (ref 0.0–7.0)
Eosinophils Absolute: 0.1 10*3/uL (ref 0.0–0.5)
HEMATOCRIT: 39.3 % (ref 34.8–46.6)
HGB: 12.9 g/dL (ref 11.6–15.9)
LYMPH#: 1.9 10*3/uL (ref 0.9–3.3)
LYMPH%: 38.9 % (ref 14.0–49.7)
MCH: 30.3 pg (ref 25.1–34.0)
MCHC: 32.9 g/dL (ref 31.5–36.0)
MCV: 92.1 fL (ref 79.5–101.0)
MONO#: 0.4 10*3/uL (ref 0.1–0.9)
MONO%: 7.6 % (ref 0.0–14.0)
NEUT#: 2.6 10*3/uL (ref 1.5–6.5)
NEUT%: 51.6 % (ref 38.4–76.8)
Platelets: 320 10*3/uL (ref 145–400)
RBC: 4.26 10*6/uL (ref 3.70–5.45)
RDW: 14.5 % (ref 11.2–14.5)
WBC: 5 10*3/uL (ref 3.9–10.3)

## 2016-12-06 NOTE — Progress Notes (Signed)
Hematology and Oncology Follow Up Visit  Sydney Herring [id number] [birth year] 66 y.o. 12/06/2016 1:37 PM Sydney Herring., PA-CKaplan, Sydney Holts., PA-C   Principle Diagnosis: 66 year old woman with splenic infarct noted in 2017. She also has recurrent deep vein thrombosis and a recent CVA most recently in March 2018. Her hypercoagulable workup, hemoglobinopathy workup and malignancy workup is unremarkable.  Current therapy: Chronically anticoagulated with Xarelto and aspirin.  Interim History: [name] presents today for a follow-up visit. Since the last visit she was hospitalized in March 2018 for left leg weakness and found to have a new CVA. She resumed to Xarelto and aspirin since that time. She has not reported any thrombosis or bleeding episodes since that time. She denied any recent hospitalizations or illnesses. She is undergoing cardiac evaluation for possible arrhythmia.  She is not report any headaches, blurry vision, syncope or seizures. She does not report any fevers, chills or sweats. She does not report any cough, wheezing or hemoptysis. She is not reporting nausea, vomiting. She does not report any constipation, diarrhea. She does not report any frequency urgency or hesitancy. She does not report any skeletal complaints. Remaining review of systems unremarkable.   Medications: I have reviewed the patient's current medications.  Current Outpatient Prescriptions  Medication Sig Dispense Refill  . acetaminophen (TYLENOL) 325 MG tablet Take 650 mg by mouth every 6 (six) hours as needed for mild pain.    [name] albuterol (PROVENTIL HFA) 108 (90 Base) MCG/ACT inhaler Inhale 2 puffs into the lungs every 4 (four) hours as needed for wheezing or shortness of breath.    [name] amLODipine (NORVASC) 2.5 MG tablet TAKE 1 TABLET BY MOUTH EVERY DAY    . aspirin 81 MG chewable tablet Chew 1 tablet (81 mg total) by mouth daily. 30 tablet 0  . atorvastatin (LIPITOR) 10 MG tablet Take 10 mg by mouth  daily.    . bisoprolol (ZEBETA) 10 MG tablet Take 1 tablet (10 mg total) by mouth daily. 90 tablet 3  . Fluticasone-Salmeterol (ADVAIR) 250-50 MCG/DOSE AEPB Inhale 1 puff into the lungs 2 (two) times daily as needed (for shortness of breath).     . lansoprazole (PREVACID) 15 MG capsule Take 15 mg by mouth daily.    . metFORMIN (GLUCOPHAGE-XR) 750 MG 24 hr tablet Take 750 mg by mouth every evening.     . Omega-3 1000 MG CAPS Take 1 capsule by mouth daily.    . Probiotic Product (PROBIOTIC PO) Take 1 tablet by mouth daily.     . rivaroxaban (XARELTO) 20 MG TABS tablet Take 20 mg by mouth daily.     No current facility-administered medications for this visit.      Allergies: No Known Allergies  Past Medical History, Surgical history, Social history, and Family History were reviewed and updated.  Physical Exam: Blood pressure 129/67, pulse 70, temperature 98.1 F (36.7 C), temperature source Oral, resp. rate 19, height 5\' 3"  (1.6 m), weight 161 lb 9.6 oz (73.3 kg), SpO2 98 %. ECOG: 0 General appearance: alert and cooperative appeared without distress. Head: Normocephalic, without obvious abnormality no oral thrush or ulcers. Neck: no adenopathy Lymph nodes: Cervical, supraclavicular, and axillary nodes normal. Heart:regular rate and rhythm, S1, S2 normal, no murmur, click, rub or gallop Lung:chest clear, no wheezing, rales, normal symmetric air entry Abdomin: soft, non-tender, without masses or organomegaly no rebound or guarding. EXT:no erythema, induration, or nodules   Lab Results: Lab Results  Component Value Date   WBC 5.0 12/06/2016  HGB 12.9 12/06/2016   HCT 39.3 12/06/2016   MCV 92.1 12/06/2016   PLT 320 12/06/2016     Chemistry      Component Value Date/Time   NA 143 08/02/2016 0309   K 3.9 08/02/2016 0309   CL 106 08/02/2016 0309   CO2 27 08/02/2016 0309   BUN 19 08/02/2016 0309   CREATININE 0.79 08/02/2016 0309      Component Value Date/Time   CALCIUM 9.1  08/02/2016 0309   ALKPHOS 86 08/01/2016 1431   AST 20 08/01/2016 1431   ALT 26 08/01/2016 1431   BILITOT 0.9 08/01/2016 1431       Impression and Plan:  66 year old woman with the following issues:  1. Splenic infarct noted in December 2017 on a CT scan of the abdomen and pelvis. She presented with left upper quadrant abdominal pain in the last year. Her hypercoagulable workup did not reveal any abnormalities. Her hemoglobinopathy screen has also been normal. Her imaging studies did not show any evidence of malignancy. Her vascular workup did not show any clear-cut abnormalities.  She did have a CVA in March 2018 and currently on Xarelto and aspirin.  The etiology of these episodes are unclear to me. I see no hematological factors contributing to this condition. Her hypercoagulable workup was reviewed again and essentially normal. She has no evidence to suggest a myeloproliferative disorder or malignancy.  I recommended left on anticoagulation with Xarelto and aspirin given her high risk of recurrence.  2. Follow-up: I'm happy to see her in the future as needed.   Eli HoseSHADAD,Sydney Fuchs, MD 7/20/20181:37 PM

## 2016-12-16 ENCOUNTER — Other Ambulatory Visit: Payer: Self-pay | Admitting: *Deleted

## 2016-12-16 DIAGNOSIS — Z79899 Other long term (current) drug therapy: Secondary | ICD-10-CM

## 2016-12-16 DIAGNOSIS — Z01812 Encounter for preprocedural laboratory examination: Secondary | ICD-10-CM

## 2016-12-17 LAB — BASIC METABOLIC PANEL
BUN/Creatinine Ratio: 23 (ref 12–28)
BUN: 21 mg/dL (ref 8–27)
CHLORIDE: 99 mmol/L (ref 96–106)
CO2: 21 mmol/L (ref 20–29)
Calcium: 9.1 mg/dL (ref 8.7–10.3)
Creatinine, Ser: 0.9 mg/dL (ref 0.57–1.00)
GFR calc non Af Amer: 67 mL/min/{1.73_m2} (ref >59)
GFR, EST AFRICAN AMERICAN: 78 mL/min/{1.73_m2} (ref >59)
Glucose: 156 mg/dL — ABNORMAL HIGH (ref 65–99)
POTASSIUM: 4.6 mmol/L (ref 3.5–5.2)
SODIUM: 139 mmol/L (ref 134–144)

## 2016-12-18 ENCOUNTER — Encounter (HOSPITAL_COMMUNITY): Payer: Self-pay

## 2016-12-18 ENCOUNTER — Ambulatory Visit (HOSPITAL_COMMUNITY)
Admission: RE | Admit: 2016-12-18 | Discharge: 2016-12-18 | Disposition: A | Discharge status: Home / Self Care | Payer: Commercial Managed Care - PPO | Source: Ambulatory Visit | Attending: Cardiology | Admitting: Cardiology

## 2016-12-18 ENCOUNTER — Ambulatory Visit (HOSPITAL_COMMUNITY): Admission: RE | Admit: 2016-12-18 | Payer: Commercial Managed Care - PPO | Source: Ambulatory Visit

## 2016-12-18 ENCOUNTER — Other Ambulatory Visit (HOSPITAL_COMMUNITY): Payer: Commercial Managed Care - PPO

## 2016-12-18 DIAGNOSIS — I493 Ventricular premature depolarization: Secondary | ICD-10-CM | POA: Diagnosis not present

## 2016-12-18 DIAGNOSIS — I2510 Atherosclerotic heart disease of native coronary artery without angina pectoris: Secondary | ICD-10-CM

## 2016-12-18 DIAGNOSIS — R0609 Other forms of dyspnea: Secondary | ICD-10-CM | POA: Diagnosis present

## 2016-12-18 DIAGNOSIS — R0602 Shortness of breath: Secondary | ICD-10-CM

## 2016-12-18 MED ORDER — SODIUM CHLORIDE 0.9 % IV BOLUS (SEPSIS)
500.0000 mL | Freq: Once | INTRAVENOUS | Status: AC
  Administered 2016-12-18: 500 mL via INTRAVENOUS

## 2016-12-18 MED ORDER — NITROGLYCERIN 0.4 MG SL SUBL
SUBLINGUAL_TABLET | SUBLINGUAL | Status: AC
  Administered 2016-12-18: 0.08 mg
  Filled 2016-12-18: qty 1

## 2016-12-18 MED ORDER — IOPAMIDOL (ISOVUE-370) INJECTION 76%
INTRAVENOUS | Status: AC
  Administered 2016-12-18: 80 mL
  Filled 2016-12-18: qty 100

## 2016-12-19 ENCOUNTER — Telehealth: Payer: Self-pay | Admitting: *Deleted

## 2016-12-19 ENCOUNTER — Ambulatory Visit (HOSPITAL_COMMUNITY)
Admission: RE | Admit: 2016-12-19 | Discharge: 2016-12-19 | Disposition: A | Discharge status: Home / Self Care | Payer: Commercial Managed Care - PPO | Source: Ambulatory Visit | Attending: Cardiology | Admitting: Cardiology

## 2016-12-19 DIAGNOSIS — I2510 Atherosclerotic heart disease of native coronary artery without angina pectoris: Secondary | ICD-10-CM | POA: Insufficient documentation

## 2016-12-19 DIAGNOSIS — Z79899 Other long term (current) drug therapy: Secondary | ICD-10-CM

## 2016-12-19 DIAGNOSIS — I493 Ventricular premature depolarization: Secondary | ICD-10-CM

## 2016-12-19 DIAGNOSIS — R9430 Abnormal result of cardiovascular function study, unspecified: Secondary | ICD-10-CM

## 2016-12-19 DIAGNOSIS — Z01818 Encounter for other preprocedural examination: Secondary | ICD-10-CM

## 2016-12-19 DIAGNOSIS — R0609 Other forms of dyspnea: Secondary | ICD-10-CM

## 2016-12-19 NOTE — Telephone Encounter (Signed)
Spoke to patient.  Dr Harding,discuss cardiac catheterization. Patient aware of labs , will come to office on 12/23/16 for full instructions.

## 2016-12-19 NOTE — Telephone Encounter (Signed)
-----   Message from Marykay Lexavid W Harding, MD sent at 12/19/2016  5:37 PM EDT ----- So he finally now have the results back from the functional study (CT FFR. I called the patient back with these results). Dr. Delton SeeNelson called to inform me that the FFR study of the LAD was indeed significant with an FFR of 0.70 after the proximal lesion in the LAD. Recommendations cardiac catheterization. I called the patient today and discussed the results. I also discussed cardiac catheterization and the risks/benefits/alternatives.  We discussed cardiac catheterization, she is agreeable to proceed. We will schedule for first case (0730) on Thursday, August 9.  Bryan Lemmaavid Harding, MD

## 2016-12-23 ENCOUNTER — Other Ambulatory Visit: Payer: Self-pay | Admitting: *Deleted

## 2016-12-23 DIAGNOSIS — R9430 Abnormal result of cardiovascular function study, unspecified: Secondary | ICD-10-CM

## 2016-12-23 DIAGNOSIS — Z01818 Encounter for other preprocedural examination: Secondary | ICD-10-CM

## 2016-12-24 ENCOUNTER — Telehealth: Payer: Self-pay

## 2016-12-24 LAB — CBC

## 2016-12-24 LAB — PROTIME-INR
INR: 1 (ref 0.8–1.2)
PROTHROMBIN TIME: 10.5 s (ref 9.1–12.0)

## 2016-12-24 NOTE — Telephone Encounter (Signed)
Left detailed message per DPR.  Patient [facility] scheduled for:  12/26/2016 @ 0730 Verified arrival time and place:  NT @ 0530 Confirmed AM meds to be taken pre-cath with sip of water: Take ASA 81 mg Xarelto- last dose Monday 12/23/2016 Metformin-last dose Tuesday evening 12/24/2016  Patient must have responsible person to drive home post procedure and observe patient for 24 hours Addl concerns:  Left this nurse name and # if any questions/concerns

## 2016-12-26 ENCOUNTER — Encounter (HOSPITAL_COMMUNITY): Payer: Self-pay | Admitting: Cardiology

## 2016-12-26 ENCOUNTER — Encounter (HOSPITAL_COMMUNITY)
Admission: RE | Disposition: A | Discharge status: Home / Self Care | Payer: Self-pay | Source: Ambulatory Visit | Attending: Cardiology

## 2016-12-26 ENCOUNTER — Ambulatory Visit (HOSPITAL_COMMUNITY)
Admission: RE | Admit: 2016-12-26 | Discharge: 2016-12-26 | Disposition: A | Discharge status: Home / Self Care | Payer: Commercial Managed Care - PPO | Source: Ambulatory Visit | Attending: Cardiology | Admitting: Cardiology

## 2016-12-26 DIAGNOSIS — I10 Essential (primary) hypertension: Secondary | ICD-10-CM | POA: Insufficient documentation

## 2016-12-26 DIAGNOSIS — Z791 Long term (current) use of non-steroidal anti-inflammatories (NSAID): Secondary | ICD-10-CM | POA: Insufficient documentation

## 2016-12-26 DIAGNOSIS — J449 Chronic obstructive pulmonary disease, unspecified: Secondary | ICD-10-CM | POA: Diagnosis not present

## 2016-12-26 DIAGNOSIS — Z86718 Personal history of other venous thrombosis and embolism: Secondary | ICD-10-CM | POA: Diagnosis not present

## 2016-12-26 DIAGNOSIS — R9439 Abnormal result of other cardiovascular function study: Secondary | ICD-10-CM | POA: Diagnosis present

## 2016-12-26 DIAGNOSIS — E119 Type 2 diabetes mellitus without complications: Secondary | ICD-10-CM | POA: Insufficient documentation

## 2016-12-26 DIAGNOSIS — I493 Ventricular premature depolarization: Secondary | ICD-10-CM | POA: Diagnosis present

## 2016-12-26 DIAGNOSIS — I69354 Hemiplegia and hemiparesis following cerebral infarction affecting left non-dominant side: Secondary | ICD-10-CM | POA: Diagnosis not present

## 2016-12-26 DIAGNOSIS — Z87891 Personal history of nicotine dependence: Secondary | ICD-10-CM | POA: Diagnosis not present

## 2016-12-26 DIAGNOSIS — R9430 Abnormal result of cardiovascular function study, unspecified: Secondary | ICD-10-CM

## 2016-12-26 DIAGNOSIS — E785 Hyperlipidemia, unspecified: Secondary | ICD-10-CM | POA: Insufficient documentation

## 2016-12-26 DIAGNOSIS — K219 Gastro-esophageal reflux disease without esophagitis: Secondary | ICD-10-CM | POA: Diagnosis not present

## 2016-12-26 DIAGNOSIS — Z79899 Other long term (current) drug therapy: Secondary | ICD-10-CM | POA: Insufficient documentation

## 2016-12-26 DIAGNOSIS — I2510 Atherosclerotic heart disease of native coronary artery without angina pectoris: Secondary | ICD-10-CM | POA: Diagnosis not present

## 2016-12-26 DIAGNOSIS — E559 Vitamin D deficiency, unspecified: Secondary | ICD-10-CM | POA: Insufficient documentation

## 2016-12-26 DIAGNOSIS — Z01818 Encounter for other preprocedural examination: Secondary | ICD-10-CM

## 2016-12-26 DIAGNOSIS — I739 Peripheral vascular disease, unspecified: Secondary | ICD-10-CM | POA: Diagnosis not present

## 2016-12-26 DIAGNOSIS — R0609 Other forms of dyspnea: Secondary | ICD-10-CM

## 2016-12-26 DIAGNOSIS — R931 Abnormal findings on diagnostic imaging of heart and coronary circulation: Secondary | ICD-10-CM | POA: Diagnosis present

## 2016-12-26 DIAGNOSIS — Z7984 Long term (current) use of oral hypoglycemic drugs: Secondary | ICD-10-CM | POA: Insufficient documentation

## 2016-12-26 HISTORY — PX: LEFT HEART CATH AND CORONARY ANGIOGRAPHY: CATH118249

## 2016-12-26 HISTORY — PX: INTRAVASCULAR PRESSURE WIRE/FFR STUDY: CATH118243

## 2016-12-26 LAB — CBC
HEMATOCRIT: 38.7 % (ref 36.0–46.0)
HEMOGLOBIN: 12.7 g/dL (ref 12.0–15.0)
MCH: 29.3 pg (ref 26.0–34.0)
MCHC: 32.8 g/dL (ref 30.0–36.0)
MCV: 89.4 fL (ref 78.0–100.0)
PLATELETS: 305 10*3/uL (ref 150–400)
RBC: 4.33 MIL/uL (ref 3.87–5.11)
RDW: 14.3 % (ref 11.5–15.5)
WBC: 5.5 10*3/uL (ref 4.0–10.5)

## 2016-12-26 LAB — BASIC METABOLIC PANEL
Anion gap: 9 (ref 5–15)
BUN: 21 mg/dL — AB (ref 6–20)
CHLORIDE: 106 mmol/L (ref 101–111)
CO2: 25 mmol/L (ref 22–32)
Calcium: 9.2 mg/dL (ref 8.9–10.3)
Creatinine, Ser: 0.8 mg/dL (ref 0.44–1.00)
GFR calc Af Amer: >60 mL/min (ref >60)
GLUCOSE: 228 mg/dL — AB (ref 65–99)
POTASSIUM: 4.1 mmol/L (ref 3.5–5.1)
Sodium: 140 mmol/L (ref 135–145)

## 2016-12-26 LAB — POCT ACTIVATED CLOTTING TIME: Activated Clotting Time: 285 seconds

## 2016-12-26 LAB — PROTIME-INR
INR: 0.94
Prothrombin Time: 12.5 seconds (ref 11.4–15.2)

## 2016-12-26 LAB — GLUCOSE, CAPILLARY: Glucose-Capillary: 230 mg/dL — ABNORMAL HIGH (ref 65–99)

## 2016-12-26 SURGERY — LEFT HEART CATH AND CORONARY ANGIOGRAPHY
Anesthesia: LOCAL

## 2016-12-26 MED ORDER — HEPARIN SODIUM (PORCINE) 1000 UNIT/ML IJ SOLN
INTRAMUSCULAR | Status: AC
  Filled 2016-12-26: qty 1

## 2016-12-26 MED ORDER — MIDAZOLAM HCL 2 MG/2ML IJ SOLN
INTRAMUSCULAR | Status: DC | PRN
Start: 2016-12-26 — End: 2016-12-26
  Administered 2016-12-26: 2 mg via INTRAVENOUS

## 2016-12-26 MED ORDER — FENTANYL CITRATE (PF) 100 MCG/2ML IJ SOLN
INTRAMUSCULAR | Status: DC | PRN
  Administered 2016-12-26: 25 ug via INTRAVENOUS

## 2016-12-26 MED ORDER — IOPAMIDOL (ISOVUE-370) INJECTION 76%
INTRAVENOUS | Status: DC | PRN
  Administered 2016-12-26: 125 mL via INTRA_ARTERIAL

## 2016-12-26 MED ORDER — SODIUM CHLORIDE 0.9% FLUSH: 3.0000 mL | INTRAVENOUS | Status: DC | PRN

## 2016-12-26 MED ORDER — VERAPAMIL HCL 2.5 MG/ML IV SOLN
INTRAVENOUS | Status: AC
  Filled 2016-12-26: qty 2

## 2016-12-26 MED ORDER — HEPARIN (PORCINE) IN NACL 2-0.9 UNIT/ML-% IJ SOLN
INTRAMUSCULAR | Status: AC
  Filled 2016-12-26: qty 1000

## 2016-12-26 MED ORDER — VERAPAMIL HCL 2.5 MG/ML IV SOLN
INTRAVENOUS | Status: DC | PRN
  Administered 2016-12-26: 08:00:00 via INTRA_ARTERIAL

## 2016-12-26 MED ORDER — BISOPROLOL FUMARATE 10 MG PO TABS
10.0000 mg | ORAL_TABLET | Freq: Every day | ORAL | Status: DC
  Administered 2016-12-26: 10 mg via ORAL
  Filled 2016-12-26 (×2): qty 1

## 2016-12-26 MED ORDER — ONDANSETRON HCL 4 MG/2ML IJ SOLN: 4.0000 mg | Freq: Four times a day (QID) | INTRAMUSCULAR | Status: DC | PRN

## 2016-12-26 MED ORDER — SODIUM CHLORIDE 0.9 % IV SOLN
250.0000 mL | INTRAVENOUS | Status: DC | PRN
Start: 2016-12-26 — End: 2016-12-26

## 2016-12-26 MED ORDER — IOPAMIDOL (ISOVUE-370) INJECTION 76%
INTRAVENOUS | Status: AC
  Filled 2016-12-26: qty 100

## 2016-12-26 MED ORDER — LIDOCAINE HCL (PF) 1 % IJ SOLN
INTRAMUSCULAR | Status: DC | PRN
  Administered 2016-12-26: 2 mL

## 2016-12-26 MED ORDER — SODIUM CHLORIDE 0.9 % IV SOLN: INTRAVENOUS | Status: AC

## 2016-12-26 MED ORDER — SODIUM CHLORIDE 0.9% FLUSH: 3.0000 mL | Freq: Two times a day (BID) | INTRAVENOUS | Status: DC

## 2016-12-26 MED ORDER — MIDAZOLAM HCL 2 MG/2ML IJ SOLN
INTRAMUSCULAR | Status: AC
  Filled 2016-12-26: qty 2

## 2016-12-26 MED ORDER — HEPARIN (PORCINE) IN NACL 2-0.9 UNIT/ML-% IJ SOLN
INTRAMUSCULAR | Status: AC | PRN
  Administered 2016-12-26: 1000 mL

## 2016-12-26 MED ORDER — SODIUM CHLORIDE 0.9 % IV SOLN
INTRAVENOUS | Status: DC
  Administered 2016-12-26: 06:00:00 via INTRAVENOUS

## 2016-12-26 MED ORDER — LIDOCAINE HCL (PF) 1 % IJ SOLN
INTRAMUSCULAR | Status: AC
  Filled 2016-12-26: qty 30

## 2016-12-26 MED ORDER — HEPARIN SODIUM (PORCINE) 1000 UNIT/ML IJ SOLN
INTRAMUSCULAR | Status: DC | PRN
Start: 2016-12-26 — End: 2016-12-26
  Administered 2016-12-26: 5000 [IU] via INTRAVENOUS
  Administered 2016-12-26: 4000 [IU] via INTRAVENOUS
  Administered 2016-12-26: 3000 [IU] via INTRAVENOUS

## 2016-12-26 MED ORDER — FENTANYL CITRATE (PF) 100 MCG/2ML IJ SOLN
INTRAMUSCULAR | Status: AC
  Filled 2016-12-26: qty 2

## 2016-12-26 MED ORDER — ADENOSINE (DIAGNOSTIC) 140MCG/KG/MIN
INTRAVENOUS | Status: DC | PRN
  Administered 2016-12-26: 140 ug/kg/min via INTRAVENOUS

## 2016-12-26 MED ORDER — ADENOSINE 12 MG/4ML IV SOLN
INTRAVENOUS | Status: AC
  Filled 2016-12-26: qty 16

## 2016-12-26 MED ORDER — ACETAMINOPHEN 325 MG PO TABS: 650.0000 mg | ORAL_TABLET | ORAL | Status: DC | PRN

## 2016-12-26 SURGICAL SUPPLY — 17 items
CATH INFINITI 5FR ANG PIGTAIL (CATHETERS) ×2 IMPLANT
CATH MICROCATH NAVVUS (MICROCATHETER) ×1 IMPLANT
CATH OPTITORQUE TIG 4.0 5F (CATHETERS) ×2 IMPLANT
CATH VISTA GUIDE 6FR JL3.5 (CATHETERS) ×2 IMPLANT
DEVICE RAD COMP TR BAND LRG (VASCULAR PRODUCTS) ×2 IMPLANT
GLIDESHEATH SLEND A-KIT 6F 22G (SHEATH) ×2 IMPLANT
GLIDESHEATH SLEND SS 6F .021 (SHEATH) ×2 IMPLANT
GUIDEWIRE INQWIRE 1.5J.035X260 (WIRE) ×1 IMPLANT
INQWIRE 1.5J .035X260CM (WIRE) ×2
KIT HEART LEFT (KITS) ×2 IMPLANT
MICROCATHETER NAVVUS (MICROCATHETER) ×2
PACK CARDIAC CATHETERIZATION (CUSTOM PROCEDURE TRAY) ×2 IMPLANT
SYR MEDRAD MARK V 150ML (SYRINGE) ×2 IMPLANT
TRANSDUCER W/STOPCOCK (MISCELLANEOUS) ×2 IMPLANT
TUBING CIL FLEX 10 FLL-RA (TUBING) ×2 IMPLANT
VALVE GUARDIAN II ~~LOC~~ HEMO (MISCELLANEOUS) ×2 IMPLANT
WIRE HI TORQ BMW 190CM (WIRE) ×2 IMPLANT

## 2016-12-26 NOTE — Discharge Instructions (Signed)

## 2016-12-26 NOTE — Interval H&P Note (Signed)
History and Physical Interval Note:  12/26/2016 7:17 AM  Sydney Herring  has presented today for surgery, with the diagnosis of abnormal cardiovascular functioning test  The various methods of treatment have been discussed with the patient and family. After consideration of risks, benefits and other options for treatment, the patient has consented to  Procedure(s): LEFT HEART CATH AND CORONARY ANGIOGRAPHY (N/A) with possible Percutaneous Coronary Intervention as a surgical intervention .  The patient's history has been reviewed, patient examined, no change in status, stable for surgery.  I have reviewed the patient's chart and labs.  Questions were answered to the patient's satisfaction.     Cath Lab Visit (complete for each Cath Lab visit)  Clinical Evaluation Leading to the Procedure:   ACS: No.  Non-ACS:    Anginal Classification: CCS II - dyspnea  Anti-ischemic medical therapy: Minimal Therapy (1 class of medications)  Non-Invasive Test Results: High-risk stress test findings: cardiac mortality >3%/year  Prior CABG: No previous CABG   Bryan Lemmaavid Jaiden Dinkins

## 2016-12-26 NOTE — Progress Notes (Signed)
Inpatient Diabetes Program Recommendations  AACE/ADA: New Consensus Statement on Inpatient Glycemic Control (2015)  Target Ranges:  Prepandial:   less than 140 mg/dL      Peak postprandial:   less than 180 mg/dL (1-2 hours)      Critically ill patients:  140 - 180 mg/dL   Lab Results  Component Value Date   GLUCAP 230 (H) 12/26/2016   HGBA1C 7.8 (H) 08/02/2016    Review of Glycemic Control  Results for Sydney Herring, John (MRN [id number]) as of 12/26/2016 10:04  Ref. Range 12/26/2016 05:43  Glucose-Capillary Latest Ref Range: 65 - 99 mg/dL 409230 (H)    Diabetes history: Type 2 Outpatient Diabetes medications: Glucophage 750mg  qday Current orders for Inpatient glycemic control: none  Inpatient Diabetes Program Recommendations: fasting CBG 230 mg/dl- please consider adding Novolog 0-9 units tid, Novolog 0-5 units qhs   Susette RacerJulie Adriena Manfre, RN, [place], [place]HA, CDE Diabetes Coordinator Inpatient Diabetes Program  [phone] (Team Pager) [phone] [facility] Office) 12/26/2016 10:08 AM

## 2016-12-26 NOTE — H&P (Signed)
History and Physical Interval Note:  NAME:  Sydney GarinDiana Herring   MRN: [id number] DOB:  1950/12/31   ADMIT DATE: 12/26/2016  PCP: Richmond CampbellKaplan, Kristen W., PA-C  CARDIOLOGIST: Bryan Lemmaavid Harding, MD  12/26/2016  Sydney GarinDiana Herring is a 66 y.o. female seen most recently on July 6th for f/u of Frequent PVCs at the request of Richmond CampbellKaplan, Kristen W., PA-C.   Sydney GarinDiana Herring was initially seen on April 24. He was noted that she has had a recent stroke with a relatively normal echocardiogram and TEE performed. There is concern about frequent PVCs noted on clinic visit EKG. We had her wear a monitor that noted mostly NSR with some brady & tachycardia along with frequent PVCs in bigeminy/trigeminy as well as couplets & triplets.  Denied any significant symptoms related to that when I saw her in follow-up on July 6. What she did not however was exertional dyspnea including activities such as climbing stairs or walking up an incline. She denied any chest tightness or pressure. From her stroke she has some residual left-sided weakness but it is not limiting. She denied any PND, orthopnea or edema. No lightheadedness, dizziness or syncope/near-syncope, TIA/amaurosis fugax symptoms since her stroke. No melena, hematochezia, hematuria, epistaxis or easy bruising. No claudication.  To evaluate her exertional dyspnea, we ordered a coronary calcium score which would be then followed up with coronary CTA and possible FFR.  She recently had her Coronary Calcium Score that was 103, so a Coronary CTA was performed that suggested possible severe proximal LAD disease.  CT FFR was performed with a score of 0.7 in the proximal LAD - consistent with an ischemic lesion. Cardiac Catheterization was recommended.  I contacted her via telephone to discuss the results in the concerning features with FFR that was significantly positive. Dr. Delton SeeNelson actually called me to tell me the results due to concern. This is the equivalent of a positive nuclear stress  test would be high read as high risk with anterior ischemia. We therefore discussed my recommendation for proceeding with cardiac catheterization. I did discuss the risks, benefits, alternatives and indications of procedure with her as well as discussing the nature of the procedure itself. She agreed to proceed and has been scheduled for outpatient procedure.   Past Medical History:  Diagnosis Date  . COPD (chronic obstructive pulmonary disease) (HCC)   . CVA (cerebral vascular accident) (HCC) 07/2016  . Diabetes mellitus without complication (HCC)   . DVT (deep venous thrombosis) (HCC)   . Gastroesophageal reflux   . Hyperlipidemia   . Hypertension   . Peripheral vascular disease (HCC)   . 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 Vitamin D deficiency    Past Surgical History:  Procedure Laterality Date  . ELBOW SURGERY    . EYE SURGERY     bilat cataracts removed  . TEE WITHOUT CARDIOVERSION N/A 05/08/2016   Procedure: TRANSESOPHAGEAL ECHOCARDIOGRAM (TEE);  Surgeon: Orpah CobbAjay Kadakia, MD; normal LV size EF 55%. No RWMA. No atrial or appendage thrombus. No PFO or ASD.  [name]. TRANSTHORACIC ECHOCARDIOGRAM  07/2016   EF 55-60%. Nl DD. Trivial MR. Mild Aortic Sclerosis  . WISDOM TOOTH EXTRACTION      Coronary Calcium Score - Coronary CTA--> CTFFR 1. Coronary calcium score of 103. This was 4981 percentile for age and sex matched control.  2. Normal coronary origin with right dominance.  3. LAD is a large vessel that gives rise to three diagonal branches. Proximal LAD has a long severe mixed plaque with high risk features  associated with 50-69% stenosis but possibly > 70% stenosis. Additional analysis with CT FFR will be performed. --> CT FFR analysis showed hemodynamically severe stenosis in the proximal LAD with CT FFR 0.70. A cardiac cath is recommended.   FAMHx: Family History  Problem Relation Age of Onset  . Diabetes Mother   . Stroke Mother   . Heart attack Mother     SOCHx:  reports  that she quit smoking about 16 months ago. She has never used smokeless tobacco. She reports that she drinks alcohol. She reports that she does not use drugs.  ALLERGIES: No Known Allergies  HOME MEDICATIONS: Prior to Admission medications   Medication Sig Start Date End Date Taking? Authorizing Provider  acetaminophen (TYLENOL) 325 MG tablet Take 650 mg by mouth every 6 (six) hours as needed for mild pain.   Yes [provider]  albuterol (PROVENTIL HFA) 108 (90 Base) MCG/ACT inhaler Inhale 2 puffs into the lungs every 4 (four) hours as needed for wheezing or shortness of breath.   Yes [provider]  amLODipine (NORVASC) 2.5 MG tablet TAKE 1 TABLET BY MOUTH EVERY DAY 11/28/16  Yes [provider]  aspirin EC 81 MG tablet Take 81 mg by mouth daily with breakfast.   Yes [provider]  atorvastatin (LIPITOR) 10 MG tablet Take 10 mg by mouth daily.   Yes [provider]  bisoprolol (ZEBETA) 5 MG tablet Take 5 mg by mouth daily.   Yes [provider]  FIBER PO Take 1 capsule by mouth daily.   Yes [provider]  Fluticasone-Salmeterol (ADVAIR) 250-50 MCG/DOSE AEPB Inhale 1 puff into the lungs 2 (two) times daily as needed (for shortness of breath).    Yes [provider]  lansoprazole (PREVACID) 15 MG capsule Take 15 mg by mouth daily. 04/21/16  Yes [provider]  MEGARED OMEGA-3 KRILL OIL 500 MG CAPS Take 500 mg by mouth daily.   Yes [provider]  metFORMIN (GLUCOPHAGE-XR) 750 MG 24 hr tablet Take 750 mg by mouth every evening.    Yes [provider]  rivaroxaban (XARELTO) 20 MG TABS tablet Take 20 mg by mouth every evening.  05/15/16  Yes [provider]  aspirin 81 MG chewable tablet Chew 1 tablet (81 mg total) by mouth daily. Patient not taking: Reported on 12/23/2016 08/04/16   Penny Pia, MD  bisoprolol (ZEBETA) 10 MG tablet Take 1 tablet (10 mg total) by mouth daily. Patient  not taking: Reported on 12/23/2016 11/22/16   Marykay Lex, MD    PHYSICAL EXAM:Blood pressure (!) 156/99, pulse 67, temperature 97.7 F (36.5 C), temperature source Oral, resp. rate 18, height 5\' 3"  (1.6 m), weight 159 lb (72.1 kg), SpO2 100 %. General appearance: alert, cooperative, appears stated age, no distress and Well-nourished, well-groomed HEENT: [place]/AT, EOMI, MMM, anicteric sclera Neck: no adenopathy, no carotid bruit and no JVD Lungs: clear to auscultation bilaterally, normal percussion bilaterally and Nonlabored, good air movement Heart: regular rate and rhythm, S1, S2 normal, no murmur, click, rub or gallop and normal apical impulse Abdomen: soft, non-tender; bowel sounds normal; no masses,  no organomegaly Extremities: extremities normal, atraumatic, no cyanosis or edema Pulses: 2+ and symmetric Skin: Skin color, texture, turgor normal. No rashes or lesions Neurologic: Alert and oriented X 3, normal strength and tone. Normal symmetric reflexes. Normal coordination and gait Cranial nerves: normal   IMPRESSION & PLAN The patients' history has been reviewed, patient examined, no change in status  from most recent note, stable for surgery. I have reviewed the patients' chart and labs. Questions were answered to the patient's satisfaction.   Sydney Herring has presented today for Cardiac Catheterizatio, with the diagnosis of Exertional Dyspnea & Abnormal Cardiac CT Angiogram with High Risk CTFFR.   The various methods of treatment have been discussed with the patient and family.   Risks / Complications include, but not limited to: Death, MI, CVA/TIA, VF/VT (with defibrillation), Bradycardia (need for temporary pacer placement), contrast induced nephropathy, bleeding / bruising / hematoma / pseudoaneurysm, vascular or coronary injury (with possible emergent CT or Vascular Surgery), adverse medication reactions, infection.     After consideration of risks, benefits and other options  for treatment, the patient has consented to Procedure(s):  LEFT HEART CATHETERIZATION AND CORONARY ANGIOGRAPHY +/- AD HOC PERCUTANEOUS CORONARY INTERVENTION RIGHT HEART CATHETERIZATION PERIPHERAL VASCULAR ANGIOGRAPHY +/- PERIPHERAL VASCULAR INTERVENTION (ATHERECTOMY, PTA, STENT)  as a surgical intervention.   We will proceed with the planned procedure.   Bryan Lemma, M.D., M.S. Interventional Cardiologist   Pager # [phone] Phone # [phone] [address] [place], [place] [address]   12/26/2016

## 2017-01-23 ENCOUNTER — Encounter: Payer: Self-pay | Admitting: Cardiology

## 2017-01-23 ENCOUNTER — Ambulatory Visit (INDEPENDENT_AMBULATORY_CARE_PROVIDER_SITE_OTHER): Payer: Commercial Managed Care - PPO | Admitting: Cardiology

## 2017-01-23 DIAGNOSIS — F17201 Nicotine dependence, unspecified, in remission: Secondary | ICD-10-CM | POA: Diagnosis not present

## 2017-01-23 DIAGNOSIS — E785 Hyperlipidemia, unspecified: Secondary | ICD-10-CM | POA: Diagnosis not present

## 2017-01-23 DIAGNOSIS — I10 Essential (primary) hypertension: Secondary | ICD-10-CM

## 2017-01-23 DIAGNOSIS — R0609 Other forms of dyspnea: Secondary | ICD-10-CM

## 2017-01-23 DIAGNOSIS — I493 Ventricular premature depolarization: Secondary | ICD-10-CM | POA: Diagnosis not present

## 2017-01-23 DIAGNOSIS — R931 Abnormal findings on diagnostic imaging of heart and coronary circulation: Secondary | ICD-10-CM

## 2017-01-23 DIAGNOSIS — I639 Cerebral infarction, unspecified: Secondary | ICD-10-CM

## 2017-01-23 NOTE — Progress Notes (Signed)
PCP: Richmond Campbell., PA-C  Clinic Note: Chief Complaint  Patient presents with  . Follow-up    2 month post CATH  . Edema    Legs, feet, and ankles but mostly left leg.    HPI: Sydney Herring is a 66 y.o. female with a PMH below who presents today for Post Follow-up.  Sydney Herring was last seen on 11/22/2016 to discuss results of her event monitor for evaluation of frequent PVCs. Monitor did not show anything significant. What she did note was more worsening exertional dyspnea which reevaluated with a coronary calcium score followed by coronary CTA with CT FFR. The intention was to see if we can correlate with PVCs and exertional dyspnea with new diagnosis of coronary disease. Also start her on bisoprolol  Recent Hospitalizations: For heart.  Studies Personally Reviewed - (if available, images/films reviewed: From Epic Chart or Care Everywhere)  Coronary Calcium Score was only 103. -> The LAD is noted to have long severe mixed plaque with high risk features ranging from 50-70%. This was evaluated with CT FFR with a resultant score of 0.7 that is considered hemodynamically significant. -> Referred for cardiac catheterization.  12/26/2008: Left Heart Catheterization with FFR of LAD Conclusion   Prox LAD lesion, 55 %stenosed. Mid LAD lesion, 55 %stenosed. - Combined lesion FFR was 0.86. (Not physiologically significant despite CT FFR suggestion of 0.7)  The left ventricular systolic function is low normal. The left ventricular ejection fraction is 50-55% by visual estimate.  LV end diastolic pressure is normal.   Patient indeed does have LAD disease in the proximal midportion angiographically does not appear significant.  By [facility] in the Cath Lab is not physiologic significant.     Interval History: Sydney Herring presents today quite happy knowing the results of her heart catheterization. She traveled to [place], [place] just after the procedure was completed. There she noted some  significant foot and ankle edema near the end of the day, but did not notice it here. She really has had no sensation of irregular flip-flopping skipped beats. She also denies any chest pressure with rest or exertion. She indicates that she is a little better shape but now that her blood pressure looks better, she's not noticing the exertional dyspnea as much.  She brings a record of her blood pressure ranging from the 90s to 120s over 80s. Heart rates have been anywhere from 45-80 beats a minute. She seems to be in good spirits and feeling much better. Her blood pressure at home today was 127/78, but indicates that when she first starts getting going in the morning if tends to increase a little bit and then goes back to normal.  She denies any chest tightness pressure with rest or exertion.  No shortness of breath with rest or exertion - unless she over exerts.  No PND, orthopnea or edema.  No palpitations, lightheadedness, dizziness, weakness or syncope/near syncope. No TIA/amaurosis fugax symptoms. No melena, hematochezia, hematuria, or epstaxis. No claudication.  ROS: Review of Systems  Musculoskeletal:       Still has left hip pain and leg weakness   I have reviewed and (if needed) personally updated the patient's problem list, medications, allergies, past medical and surgical history, social and family history.   Past Medical History:  Diagnosis Date  . COPD (chronic obstructive pulmonary disease) (HCC)   . CVA (cerebral vascular accident) (HCC) 07/2016  . Diabetes mellitus without complication (HCC)   . DVT (deep venous thrombosis) (HCC)   .  Gastroesophageal reflux   . Hyperlipidemia   . Hypertension   . Peripheral vascular disease (HCC)   . 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 Vitamin D deficiency     Past Surgical History:  Procedure Laterality Date  . ELBOW SURGERY    . EYE SURGERY     bilat cataracts removed  . INTRAVASCULAR PRESSURE WIRE/FFR STUDY N/A 12/26/2016   Procedure:  INTRAVASCULAR PRESSURE WIRE/FFR STUDY;  Surgeon: Marykay Lex, MD;  Location: [facility] [place] INVASIVE CV LAB;  Service: Cardiovascular;: FFR obtained lesions in the LAD. Coronary CTA suggested positive FFR. Final invasive FFR was 0.86.  [name] LEFT HEART CATH AND CORONARY ANGIOGRAPHY N/A 12/26/2016   Procedure: LEFT HEART CATH AND CORONARY ANGIOGRAPHY;  Surgeon: Marykay Lex, MD;  Location: [facility] INVASIVE CV LAB;  Service: Cardiovascular: Tendon 50-55% lesions in the proximal and mid LAD. Evaluated with FFR  . TEE WITHOUT CARDIOVERSION N/A 05/08/2016   Procedure: TRANSESOPHAGEAL ECHOCARDIOGRAM (TEE);  Surgeon: Orpah Cobb, MD; normal LV size EF 55%. No RWMA. No atrial or appendage thrombus. No PFO or ASD.  [name] TRANSTHORACIC ECHOCARDIOGRAM  07/2016   EF 55-60%. Nl DD. Trivial MR. Mild Aortic Sclerosis  . WISDOM TOOTH EXTRACTION      Current Meds  Medication Sig  . acetaminophen (TYLENOL) 325 MG tablet Take 650 mg by mouth every 6 (six) hours as needed for mild pain.  [name] albuterol (PROVENTIL HFA) 108 (90 Base) MCG/ACT inhaler Inhale 2 puffs into the lungs every 4 (four) hours as needed for wheezing or shortness of breath.  [name] aspirin 81 MG chewable tablet Chew 1 tablet (81 mg total) by mouth daily.  [name] aspirin EC 81 MG tablet Take 81 mg by mouth daily with breakfast.  . atorvastatin (LIPITOR) 10 MG tablet Take 10 mg by mouth daily.  . bisoprolol (ZEBETA) 10 MG tablet Take 1 tablet (10 mg total) by mouth daily.  [name] FIBER PO Take 1 capsule by mouth daily.  . Fluticasone-Salmeterol (ADVAIR) 250-50 MCG/DOSE AEPB Inhale 1 puff into the lungs 2 (two) times daily as needed (for shortness of breath).   . lansoprazole (PREVACID) 15 MG capsule Take 15 mg by mouth daily.  [name] MEGARED OMEGA-3 KRILL OIL 500 MG CAPS Take 500 mg by mouth daily.  . metFORMIN (GLUCOPHAGE-XR) 750 MG 24 hr tablet Take 750 mg by mouth every evening.   . rivaroxaban (XARELTO) 20 MG TABS tablet Take 20 mg by mouth every evening.     No Known  Allergies  Social History   Social History  . Marital status: Married    Spouse name: Sydney Herring  . Number of children: 0  . Years of education: 12   Occupational History  .      retired   Social History Main Topics  . Smoking status: Former Smoker    Quit date: 08/19/2015  . Smokeless tobacco: Never Used  . Alcohol use 0.0 oz/week     Comment: social  . Drug use: No  . Sexual activity: Not Asked   Other Topics Concern  . None   Social History Narrative   Lives with husband    family history includes Diabetes in her mother; Heart attack in her mother; Stroke in her mother.  Wt Readings from Last 3 Encounters:  01/23/17 160 lb (72.6 kg)  12/26/16 159 lb (72.1 kg)  12/06/16 161 lb 9.6 oz (73.3 kg)    PHYSICAL EXAM BP 140/88   Pulse 70   Ht  (1.6 m)   Wt  160 lb (72.6 kg)   BMI 28.34 kg/m  Physical Exam  Constitutional: She is oriented to person, place, and time. She appears well-developed and well-nourished. No distress.  Healthy-appearing. Well-groomed  HENT:  Head: Normocephalic and atraumatic.  Eyes: EOM are normal.  Neck: No hepatojugular reflux and no JVD present. Carotid bruit is not present.  Cardiovascular: Normal rate, regular rhythm, normal heart sounds and intact distal pulses.  Exam reveals no gallop and no friction rub.   No murmur heard. Pulmonary/Chest: Breath sounds normal. No respiratory distress. She has no wheezes. She has no rales.  Abdominal: Soft. Bowel sounds are normal. She exhibits no distension. There is no tenderness. There is no rebound.  Musculoskeletal: Normal range of motion. She exhibits edema (Left greater than right. Mild).  Neurological: She is alert and oriented to person, place, and time.  Skin: Skin is warm and dry.  Vitals reviewed.   Adult ECG Report n/a  Other studies Reviewed: Additional studies/ records that were reviewed today include:  Recent Labs:   Lab Results  Component Value Date   CHOL 129 08/02/2016    HDL 40 (L) 08/02/2016   LDLCALC 57 08/02/2016   TRIG 161 (H) 08/02/2016   CHOLHDL 3.2 08/02/2016     ASSESSMENT / PLAN: Problem List Items Addressed This Visit    Abnormal cardiac CT angiography (Chronic)    The angiographic evaluation of the LAD system with what was seen the CT scan, but the invasive FFR was not significant. Unfortunately, the physiologically significant finding on the CT FFR was probably related to the fact there is diffuse disease throughout the LAD and indeed the distal LAD may be ischemic. Unfortunately just exactly which stenosis is physiologically significant would be impossible to evaluate.  Essentially this point we consider nonobstructive CAD and continue aggressive risk factor modification. She is on aspirin and statin and a beta blocker at this point. Since she is not really having any anginal symptoms we can have her stop taking amlodipine because she is actually not been taking him home.      Essential hypertension (Chronic)    Better control with the beta blocker and no longer requiring amlodipine. If she has higher blood pressures, she can take as needed to      Exertional dyspnea    I think this is probably more related to deconditioning.  The CT scan FFR suggested physiological significance, but this did not bear treated with the invasive evaluation. Normal EF and diastolic function/EDP      Frequent multifocal PVCs (Chronic)    These PVCs are concerning because of the amount of them, but LAD did not seem to be ischemic. She really doesn't feel them. Will simply continue with increased dose of beta blocker.      Hyperlipidemia with target low density lipoprotein (LDL) cholesterol less than 70 mg/dL (Chronic)    Currently on low-dose atorvastatin and LDL looks pretty good as of March. By CT scan, she as of yet non-flow-limiting disease in the LAD, so we need to continue to be aggressive risk factor modification. She seems to be tolerating it relatively  well. Labs look close to goal, would consider an MR testing follow-up.      Tobacco abuse, in remission (Chronic)    Continues to avoid cigarettes this time         Current medicines are reviewed at length with the patient today. (+/- concerns) none The following changes have been made: none  Patient Instructions  Medication  Instructions:  You can take your Norvasc as needed when ever you blood pressure is elevated and your top number is over 140.   Labwork: None   Testing/Procedures: None   Follow-Up: Your physician wants you to follow-up in: 12 months with Dr Herbie Baltimore. You will receive a reminder letter in the mail two months in advance. If you don't receive a letter, please call our office to schedule the follow-up appointment.   Any Other Special Instructions Will Be Listed Below (If Applicable).     If you need a refill on your cardiac medications before your next appointment, please call your pharmacy.    Studies Ordered:   No orders of the defined types were placed in this encounter.     Bryan Lemma, M.D., M.S. Interventional Cardiologist   Pager # [phone] Phone # [phone] [address] [place], [place] [address]

## 2017-01-23 NOTE — Patient Instructions (Signed)
Medication Instructions:  You can take your Norvasc as needed when ever you blood pressure is elevated and your top number is over 140.   Labwork: None   Testing/Procedures: None   Follow-Up: Your physician wants you to follow-up in: 12 months with Dr Herbie BaltimoreHarding. You will receive a reminder letter in the mail two months in advance. If you don't receive a letter, please call our office to schedule the follow-up appointment.   Any Other Special Instructions Will Be Listed Below (If Applicable).     If you need a refill on your cardiac medications before your next appointment, please call your pharmacy.

## 2017-01-25 ENCOUNTER — Encounter: Payer: Self-pay | Admitting: Cardiology

## 2017-01-25 NOTE — Assessment & Plan Note (Signed)
I think this is probably more related to deconditioning.  The CT scan FFR suggested physiological significance, but this did not bear treated with the invasive evaluation. Normal EF and diastolic function/EDP

## 2017-01-25 NOTE — Assessment & Plan Note (Signed)
Better control with the beta blocker and no longer requiring amlodipine. If she has higher blood pressures, she can take as needed to

## 2017-01-25 NOTE — Assessment & Plan Note (Signed)
These PVCs are concerning because of the amount of them, but LAD did not seem to be ischemic. She really doesn't feel them. Will simply continue with increased dose of beta blocker.

## 2017-01-25 NOTE — Assessment & Plan Note (Addendum)
Currently on low-dose atorvastatin and LDL looks pretty good as of March. By CT scan, she as of yet non-flow-limiting disease in the LAD, so we need to continue to be aggressive risk factor modification. She seems to be tolerating it relatively well. Labs look close to goal, would consider an MR testing follow-up.

## 2017-01-25 NOTE — Assessment & Plan Note (Signed)
The angiographic evaluation of the LAD system with what was seen the CT scan, but the invasive FFR was not significant. Unfortunately, the physiologically significant finding on the CT FFR was probably related to the fact there is diffuse disease throughout the LAD and indeed the distal LAD may be ischemic. Unfortunately just exactly which stenosis is physiologically significant would be impossible to evaluate.  Essentially this point we consider nonobstructive CAD and continue aggressive risk factor modification. She is on aspirin and statin and a beta blocker at this point. Since she is not really having any anginal symptoms we can have her stop taking amlodipine because she is actually not been taking him home.

## 2017-01-25 NOTE — Assessment & Plan Note (Signed)
Continues to avoid cigarettes this time

## 2017-03-01 ENCOUNTER — Other Ambulatory Visit: Payer: Self-pay | Admitting: Cardiology

## 2017-03-17 NOTE — Progress Notes (Signed)
PhysicalDebrox work regarding multiple  [facility]  PATIENT: Jeweline Reif DOB: 1950-08-15   REASON FOR VISIT:  follow-up for stroke  HISTORY FROM: Patient    HISTORY OF PRESENT ILLNESS: UPDATE 10/30/2018CM Ms. Hartl, 66 year old female returns for follow-up for stroke which occurred in March 2018. She denies further stroke or TIA symptoms. She is currently on aspirin for secondary stroke prevention and Xarelto for DVT prophylaxis.She has not had further stroke or TIA symptoms. She has minimal bruising and no bleeding. Blood pressure in the office today 135/86. She remains on Lipitor without myalgias. She continues to be active.Carotid Doppler  After her last visit shows  fairly normal findings mild carotid stenosis bilaterally.Cardiac event monitor only showed PVCs her beta blocker was increased and she is to f/u yearly with Dr. Herbie Baltimore. She is back to all of her normal activities. She is due for routine labs at her primary care Dr. Arlyce Dice the first week of November. She returns for reevaluation   09/12/16 CMMs. Kueker, 66 year old female returns for hospital follow-up. She was admitted on 08/01/2016 after waking up with a gait imbalance the day before and dropping things out of the right side morning she was admitted for dizziness and weakness on the left side. She has been on Xarelto for DVT prophylaxis. She has a medical history of COPD, diabetes hyperlipidemia hypertension and peripheral vascular disease and vitamin D deficiency. CTA head and neck without significant finding on the study mild atherosclerotic disease of the aortic arch no large or medium vessel intracranial finding. MRI of the brain 1.6 mm focus of diffusion restriction within the right mid corona radiata comparable with acute subacute infarction. No associated hemorrhage. 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 Mild chronic microvascular ischemic changes. Aspirin was added to her   Xarelto. 2-D echo EF 55-60% On follow-up visit to the clinic today she has not had further stroke or TIA symptoms. She remains on Xarelto and aspirin without significant bruising or bleeding. She remains on Lipitor for hyperlipidemia without complaints of myalgias. LDL 57 triglycerides were elevated at 161. Hemoglobin A1c 7.8. She remains on her diabetic medications. 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 She is to have a 30 day cardiac event monitor placed next week along with a carotid Doppler study. She returns for reevaluation   REVIEW OF SYSTEMS: Full 14 system review of systems performed and notable only for those listed, all others are neg:  Constitutional: neg  Cardiovascular: neg Ear/Nose/Throat: neg  Skin: neg Eyes: neg Respiratory: neg Gastroitestinal: neg  Hematology/Lymphatic: neg  Endocrine: neg Musculoskeletal:neg Allergy/Immunology: Seasonal allergies Neurological: neg Psychiatric: neg Sleep : neg   ALLERGIES: No Known Allergies  HOME MEDICATIONS: Outpatient Medications Prior to Visit  Medication Sig Dispense Refill  . acetaminophen (TYLENOL) 325 MG tablet Take 650 mg by mouth every 6 (six) hours as needed for mild pain.    [name] albuterol (PROVENTIL HFA) 108 (90 Base) MCG/ACT inhaler Inhale 2 puffs into the lungs every 4 (four) hours as needed for wheezing or shortness of breath.    [name] amLODipine (NORVASC) 2.5 MG tablet TAKE 1 TABLET BY MOUTH EVERY DAY    . aspirin 81 MG chewable tablet Chew 1 tablet (81 mg total) by mouth daily. 30 tablet 0  . atorvastatin (LIPITOR) 10 MG tablet Take 10 mg by mouth daily.    [name] FIBER PO Take 1 capsule by mouth daily.    . Fluticasone-Salmeterol (ADVAIR) 250-50 MCG/DOSE AEPB  Inhale 1 puff into the lungs 2 (two) times daily as needed (for shortness of breath).     . lansoprazole (PREVACID) 15 MG capsule Take 15 mg by mouth daily.    [name] MEGARED OMEGA-3 KRILL OIL 500 MG CAPS Take 500 mg by mouth daily.    . metFORMIN  (GLUCOPHAGE-XR) 750 MG 24 hr tablet Take 750 mg by mouth every evening.     . rivaroxaban (XARELTO) 20 MG TABS tablet Take 20 mg by mouth every evening.     [name] aspirin EC 81 MG tablet Take 81 mg by mouth daily with breakfast.    . bisoprolol (ZEBETA) 10 MG tablet Take 1 tablet (10 mg total) by mouth daily. 90 tablet 3   No facility-administered medications prior to visit.     PAST MEDICAL HISTORY: Past Medical History:  Diagnosis Date  . COPD (chronic obstructive pulmonary disease) (HCC)   . CVA (cerebral vascular accident) (HCC) 07/2016  . Diabetes mellitus without complication (HCC)   . DVT (deep venous thrombosis) (HCC)   . Gastroesophageal reflux   . Hyperlipidemia   . Hypertension   . Peripheral vascular disease (HCC)   . 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 Vitamin D deficiency     PAST SURGICAL HISTORY: Past Surgical History:  Procedure Laterality Date  . ELBOW SURGERY    . EYE SURGERY     bilat cataracts removed  . INTRAVASCULAR PRESSURE WIRE/FFR STUDY N/A 12/26/2016   Procedure: INTRAVASCULAR PRESSURE WIRE/FFR STUDY;  Surgeon: Marykay Lex, MD;  Location: [facility] INVASIVE CV LAB;  Service: Cardiovascular;: FFR obtained lesions in the LAD. Coronary CTA suggested positive FFR. Final invasive FFR was 0.86.  [name] LEFT HEART CATH AND CORONARY ANGIOGRAPHY N/A 12/26/2016   Procedure: LEFT HEART CATH AND CORONARY ANGIOGRAPHY;  Surgeon: Marykay Lex, MD;  Location: [facility] INVASIVE CV LAB;  Service: Cardiovascular: Tendon 50-55% lesions in the proximal and mid LAD. Evaluated with FFR  . TEE WITHOUT CARDIOVERSION N/A 05/08/2016   Procedure: TRANSESOPHAGEAL ECHOCARDIOGRAM (TEE);  Surgeon: Orpah Cobb, MD; normal LV size EF 55%. No RWMA. No atrial or appendage thrombus. No PFO or ASD.  [name] TRANSTHORACIC ECHOCARDIOGRAM  07/2016   EF 55-60%. Nl DD. Trivial MR. Mild Aortic Sclerosis  . WISDOM TOOTH EXTRACTION      FAMILY HISTORY: Family History  Problem Relation Age of Onset  . Diabetes Mother   .  Stroke Mother   . Heart attack Mother     SOCIAL HISTORY: Social History   Social History  . Marital status: Married    Spouse name: Smitty Cords  . Number of children: 0  . Years of education: 12   Occupational History  .      retired   Social History Main Topics  . Smoking status: Former Smoker    Quit date: 08/19/2015  . Smokeless tobacco: Never Used  . Alcohol use 0.0 oz/week     Comment: social  . Drug use: No  . Sexual activity: Not on file   Other Topics Concern  . Not on file   Social History Narrative   Lives with husband     PHYSICAL EXAM  Vitals:   03/18/17 1345  BP: 135/86  Pulse: 84  Weight: 164 lb 6.4 oz (74.6 kg)  Height: 5\' 3"  (1.6 m)   Body mass index is 29.12 kg/m.  Generalized: Well developed, in no acute distress  Head: normocephalic and atraumatic,. Oropharynx benign  Neck: Supple, no carotid bruits  Cardiac: Regular rate rhythm,  no murmur  Musculoskeletal: No deformity   Neurological examination   Mentation: Alert oriented to time, place, history taking. Attention span and concentration appropriate. Recent and remote memory intact.  Follows all commands speech and language fluent.   Cranial nerve II-XII: Pupils were equal round reactive to light extraocular movements were full, visual field were full on confrontational test. Facial sensation and strength were normal. hearing was intact to finger rubbing bilaterally. Uvula tongue midline. head turning and shoulder shrug were normal and symmetric.Tongue protrusion into cheek strength was normal. Motor: normal bulk and tone, full strength in the BUE, BLE, fine finger movements normal, no pronator drift. No focal weakness Sensory: normal and symmetric to light touch, pinprick, and  Vibration, in the upper and lower extremities Coordination: finger-nose-finger, heel-to-shin bilaterally, no dysmetria Reflexes: symmetric upper and lower, plantar responses were flexor bilaterally. Gait and Station:  Rising up from seated position without assistance, normal stance,  moderate stride, good arm swing, smooth turning, able to perform tiptoe, and heel walking without difficulty. Tandem gait is steady  DIAGNOSTIC DATA (LABS, IMAGING, TESTING) - I reviewed patient records, labs, notes, testing and imaging myself where available.  Lab Results  Component Value Date   WBC 5.5 12/26/2016   HGB 12.7 12/26/2016   HCT 38.7 12/26/2016   MCV 89.4 12/26/2016   PLT 305 12/26/2016      Component Value Date/Time   NA 140 12/26/2016 0625   NA 139 12/16/2016 0934   K 4.1 12/26/2016 0625   CL 106 12/26/2016 0625   CO2 25 12/26/2016 0625   GLUCOSE 228 (H) 12/26/2016 0625   BUN 21 (H) 12/26/2016 0625   BUN 21 12/16/2016 0934   CREATININE 0.80 12/26/2016 0625   CALCIUM 9.2 12/26/2016 0625   PROT 7.8 08/01/2016 1431   ALBUMIN 4.4 08/01/2016 1431   AST 20 08/01/2016 1431   ALT 26 08/01/2016 1431   ALKPHOS 86 08/01/2016 1431   BILITOT 0.9 08/01/2016 1431   GFRNONAA >60 12/26/2016 0625   GFRAA >60 12/26/2016 0625   Lab Results  Component Value Date   CHOL 129 08/02/2016   HDL 40 (L) 08/02/2016   LDLCALC 57 08/02/2016   TRIG 161 (H) 08/02/2016   CHOLHDL 3.2 08/02/2016   Lab Results  Component Value Date   HGBA1C 7.8 (H) 08/02/2016    Lab Results  Component Value Date   TSH 1.326 08/01/2016      ASSESSMENT AND PLAN  66 y.o. year old female  has a past medical history of COPD (chronic obstructive pulmonary disease) (HCC); Diabetes mellitus without complication (HCC); DVT (deep venous thrombosis) (HCC); Gastroesophageal reflux; Hyperlipidemia; Hypertension; Peripheral vascular disease (HCC); Splenic infarct (04/2016); and Vitamin D deficiency here for  follow-up for stroke in March 2018. [name]..MRI of the brain 1.6 mm focus of diffusion restriction within the right mid corona radiata comparable with acute subacute infarction. No associated hemorrhage. Small chronic infarct in the left frontal  lobe and anterior insula. Mild chronic microvascular ischemic changes.   PLAN: Stressed the importance of management of risk factors to prevent further stroke Continue Xarelto and aspirin for secondary stroke prevention and DVT prophylaxis Maintain strict control of hypertension with blood pressure goal below 130/90, today's reading135/86  Stay well hydrated Control of diabetes with hemoglobin A1c below 6.5 followed by primary care  continue diabetic medications Cholesterol with LDL cholesterol less than 70, followed by primary care,  continue Lipitor Exercise by walking, bowling  eat healthy diet with whole grains,  fresh  fruits and vegetables Carotid Doppler  shows  fairly normal findings mild carotid stenosis bilaterally Cardiac monitor only showed PVCs her beta blocker was increased to f/u yearly with Dr. Herbie Baltimore Will discharge I spent 25 minutes in total face to face time with the patient more than 50% of which was spent counseling and coordination of care, reviewing test results reviewing medications and discussing and reviewing the diagnosis of stroke and management of risk factors, importance of continued follow up with primary care and cardiology Nilda Riggs, [facility], [facility], APRN  [facility] [address] [place], [place] [address] [phone]

## 2017-03-18 ENCOUNTER — Encounter: Payer: Self-pay | Admitting: Nurse Practitioner

## 2017-03-18 ENCOUNTER — Encounter (INDEPENDENT_AMBULATORY_CARE_PROVIDER_SITE_OTHER): Payer: Self-pay

## 2017-03-18 ENCOUNTER — Ambulatory Visit (INDEPENDENT_AMBULATORY_CARE_PROVIDER_SITE_OTHER): Payer: Commercial Managed Care - PPO | Admitting: Nurse Practitioner

## 2017-03-18 VITALS — BP 135/86 | HR 84 | Ht 63.0 in | Wt 164.4 lb

## 2017-03-18 DIAGNOSIS — I639 Cerebral infarction, unspecified: Secondary | ICD-10-CM

## 2017-03-18 DIAGNOSIS — I10 Essential (primary) hypertension: Secondary | ICD-10-CM | POA: Diagnosis not present

## 2017-03-18 DIAGNOSIS — I63039 Cerebral infarction due to thrombosis of unspecified carotid artery: Secondary | ICD-10-CM

## 2017-03-18 DIAGNOSIS — E785 Hyperlipidemia, unspecified: Secondary | ICD-10-CM | POA: Diagnosis not present

## 2017-03-18 NOTE — Patient Instructions (Addendum)
Stressed the importance of management of risk factors to prevent further stroke Continue Xarelto and aspirin for secondary stroke prevention and DVT prophylaxis Maintain strict control of hypertension with blood pressure goal below 130/90, today's reading135/86  Stay well hydrated Control of diabetes with hemoglobin A1c below 6.5 followed by primary care  continue diabetic medications Cholesterol with LDL cholesterol less than 70, followed by primary care,  continue Lipitor Exercise by walking, bowling  eat healthy diet with whole grains,  fresh fruits and vegetables Carotid Doppler  shows fair fairly normal findings mild carotid stenosis bilaterally Cardiac monitor only showed PVCs her beta blocker was increased to f/u yearly with Dr. Herbie Baltimore Will discharge Stroke Prevention Some medical conditions and behaviors are associated with an increased chance of having a stroke. You may prevent a stroke by making healthy choices and managing medical conditions. How can I reduce my risk of having a stroke?  Stay physically active. Get at least 30 minutes of activity on most or all days.  Do not smoke. It may also be helpful to avoid exposure to secondhand smoke.  Limit alcohol use. Moderate alcohol use is considered to be: ? No more than 2 drinks per day for men. ? No more than 1 drink per day for nonpregnant women.  Eat healthy foods. This involves: ? Eating 5 or more servings of fruits and vegetables a day. ? Making dietary changes that address high blood pressure (hypertension), high cholesterol, diabetes, or obesity.  Manage your cholesterol levels. ? Making food choices that are high in fiber and low in saturated fat, trans fat, and cholesterol may control cholesterol levels. ? Take any prescribed medicines to control cholesterol as directed by your health care provider.  Manage your diabetes. ? Controlling your carbohydrate and sugar intake is recommended to manage diabetes. ? Take any  prescribed medicines to control diabetes as directed by your health care provider.  Control your hypertension. ? Making food choices that are low in salt (sodium), saturated fat, trans fat, and cholesterol is recommended to manage hypertension. ? Ask your health care provider if you need treatment to lower your blood pressure. Take any prescribed medicines to control hypertension as directed by your health care provider. ? If you are 70-70 years of age, have your blood pressure checked every 3-5 years. If you are 82 years of age or older, have your blood pressure checked every year.  Maintain a healthy weight. ? Reducing calorie intake and making food choices that are low in sodium, saturated fat, trans fat, and cholesterol are recommended to manage weight.  Stop drug abuse.  Avoid taking birth control pills. ? Talk to your health care provider about the risks of taking birth control pills if you are over 25 years old, smoke, get migraines, or have ever had a blood clot.  Get evaluated for sleep disorders (sleep apnea). ? Talk to your health care provider about getting a sleep evaluation if you snore a lot or have excessive sleepiness.  Take medicines only as directed by your health care provider. ? For some people, aspirin or blood thinners (anticoagulants) are helpful in reducing the risk of forming abnormal blood clots that can lead to stroke. If you have the irregular heart rhythm of atrial fibrillation, you should be on a blood thinner unless there is a good reason you cannot take them. ? Understand all your medicine instructions.  Make sure that other conditions (such as anemia or atherosclerosis) are addressed. Get help right away if:  You have sudden weakness or numbness of the face, arm, or leg, especially on one side of the body.  Your face or eyelid droops to one side.  You have sudden confusion.  You have trouble speaking (aphasia) or understanding.  You have sudden  trouble seeing in one or both eyes.  You have sudden trouble walking.  You have dizziness.  You have a loss of balance or coordination.  You have a sudden, severe headache with no known cause.  You have new chest pain or an irregular heartbeat. Any of these symptoms may represent a serious problem that is an emergency. Do not wait to see if the symptoms will go away. Get medical help at once. Call your local emergency services (911 in [place] Do not drive yourself to the hospital. This information is not intended to replace advice given to you by your health care provider. Make sure you discuss any questions you have with your health care provider. Document Released: 06/13/2004 Document Revised: 10/12/2015 Document Reviewed: 11/06/2012 Elsevier Interactive Patient Education  2017 [employer]

## 2017-03-20 NOTE — Progress Notes (Signed)
I reviewed above note and agree with the assessment and plan.  Marvel PlanJindong Camryn Quesinberry, MD PhD Stroke Neurology 03/20/2017 3:08 PM

## 2017-12-18 DIAGNOSIS — I2510 Atherosclerotic heart disease of native coronary artery without angina pectoris: Secondary | ICD-10-CM

## 2017-12-18 HISTORY — DX: Atherosclerotic heart disease of native coronary artery without angina pectoris: I25.10

## 2019-03-10 DIAGNOSIS — Z23 Encounter for immunization: Secondary | ICD-10-CM | POA: Diagnosis not present

## 2019-05-21 DIAGNOSIS — I472 Ventricular tachycardia: Secondary | ICD-10-CM

## 2019-05-21 DIAGNOSIS — R000 Tachycardia, unspecified: Secondary | ICD-10-CM

## 2019-05-21 HISTORY — DX: Tachycardia, unspecified: R00.0

## 2019-05-21 HISTORY — DX: Ventricular tachycardia: I47.2

## 2019-06-01 ENCOUNTER — Ambulatory Visit: Payer: Commercial Managed Care - PPO | Admitting: Cardiology

## 2019-06-02 ENCOUNTER — Encounter: Payer: Self-pay | Admitting: Cardiology

## 2019-06-02 ENCOUNTER — Ambulatory Visit: Payer: Medicare HMO | Admitting: Cardiology

## 2019-06-02 ENCOUNTER — Other Ambulatory Visit: Payer: Self-pay

## 2019-06-02 VITALS — BP 127/85 | HR 79 | Temp 97.3°F | Ht 64.0 in | Wt 155.4 lb

## 2019-06-02 DIAGNOSIS — R0609 Other forms of dyspnea: Secondary | ICD-10-CM

## 2019-06-02 DIAGNOSIS — I10 Essential (primary) hypertension: Secondary | ICD-10-CM | POA: Diagnosis not present

## 2019-06-02 DIAGNOSIS — E785 Hyperlipidemia, unspecified: Secondary | ICD-10-CM

## 2019-06-02 DIAGNOSIS — I493 Ventricular premature depolarization: Secondary | ICD-10-CM

## 2019-06-02 DIAGNOSIS — D735 Infarction of spleen: Secondary | ICD-10-CM

## 2019-06-02 DIAGNOSIS — I2510 Atherosclerotic heart disease of native coronary artery without angina pectoris: Secondary | ICD-10-CM | POA: Diagnosis not present

## 2019-06-02 DIAGNOSIS — E1169 Type 2 diabetes mellitus with other specified complication: Secondary | ICD-10-CM

## 2019-06-02 DIAGNOSIS — R0600 Dyspnea, unspecified: Secondary | ICD-10-CM

## 2019-06-02 DIAGNOSIS — I82532 Chronic embolism and thrombosis of left popliteal vein: Secondary | ICD-10-CM

## 2019-06-02 DIAGNOSIS — E669 Obesity, unspecified: Secondary | ICD-10-CM

## 2019-06-02 NOTE — Patient Instructions (Signed)
Medication Instructions:  No change *If you need a refill on your cardiac medications before your next appointment, please call your pharmacy*  Lab Work: Not needed  Testing/Procedures: Not needed  Follow-Up: At [facility], you and your health needs are our priority.  As part of our continuing mission to provide you with exceptional heart care, we have created designated Provider Care Teams.  These Care Teams include your primary Cardiologist (physician) and Advanced Practice Providers (APPs -  Physician Assistants and Nurse Practitioners) who all work together to provide you with the care you need, when you need it.  Your next appointment:   12 month(s)  The format for your next appointment:   In Person  Provider:   Bryan Lemma, MD  Other Instructions

## 2019-06-02 NOTE — Progress Notes (Signed)
Primary Care Provider: Richmond Campbell., PA-C Cardiologist: No primary care provider on file. Electrophysiologist:   Clinic Note: Chief Complaint  Patient presents with  . Follow-up    Delayed almost 2-year follow-up  . Coronary Artery Disease    Nonocclusive by cath    HPI:    Sydney Herring is a 69 y.o. female with a PMH notable for mild coronary calcification on CT with moderate plaque noted on coronary CTA.  She returns today for delayed 2-year follow-up.Sydney Herring  Sydney Herring was last seen on January 23, 2017 for a post cath follow-up.  Recent Hospitalizations: None  Reviewed  CV studies:    The following studies were reviewed today: (if available, images/films reviewed: From Epic Chart or Care Everywhere) . None since cardiac cath in August 2019:   Interval History:   Sydney Herring returns here today for a much delayed cardiology follow-up relatively stable overall from a cardiac standpoint.  She is feeling fine.  She remains very active and has not had any further cardiac evaluations.  She has not had any issues with blood pressure, no syncope or near syncope.  No angina or heart failure symptoms.  No stroke or TIA symptoms.  No arrhythmia symptoms. He is on long-term Xarelto for history of DVT  CV Review of Symptoms (Summary): no chest pain or dyspnea on exertion negative for - edema, irregular heartbeat, murmur, orthopnea, palpitations, paroxysmal nocturnal dyspnea, rapid heart rate, shortness of breath or Syncope/near syncope, TIA/amaurosis fugax, claudication.  The patient does not have symptoms concerning for COVID-19 infection (fever, chills, cough, or new shortness of breath).  The patient is practicing social distancing & Masking.    REVIEWED OF SYSTEMS   A comprehensive ROS was performed. Review of Systems  Constitutional: Negative for malaise/fatigue.  HENT: Negative for congestion and nosebleeds.   Respiratory: Negative for shortness of breath.    Gastrointestinal: Negative for blood in stool and melena.  Genitourinary: Negative for flank pain and hematuria.  Musculoskeletal: Negative for joint pain.  Neurological: Negative for dizziness, focal weakness, weakness and headaches.  Psychiatric/Behavioral: Negative for memory loss. The patient is not nervous/anxious and does not have insomnia.    I have reviewed and (if needed) personally updated the patient's problem list, medications, allergies, past medical and surgical history, social and family history.   PAST MEDICAL HISTORY   Past Medical History:  Diagnosis Date  . COPD (chronic obstructive pulmonary disease) (HCC)   . 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 Evaluated with FFR (0.86) - Not physiologically significant.  . CVA (cerebral vascular accident) (HCC) 07/2016  . Diabetes mellitus without complication (HCC)   . DVT (deep venous thrombosis) (HCC)   . Gastroesophageal reflux   . Hyperlipidemia   . Hypertension   . Peripheral vascular disease (HCC)   . 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 Vitamin D deficiency     PAST SURGICAL HISTORY   Past Surgical History:  Procedure Laterality Date  . ELBOW SURGERY    . EYE SURGERY     bilat cataracts removed  . INTRAVASCULAR PRESSURE WIRE/FFR STUDY N/A 12/26/2016   Procedure: INTRAVASCULAR PRESSURE WIRE/FFR STUDY;  Surgeon: Marykay Lex, MD;  Location: [facility] INVASIVE CV LAB;  Service: Cardiovascular;: FFR obtained lesions in the LAD. Coronary CTA suggested positive FFR. Final invasive FFR was 0.86.  Sydney Herring LEFT HEART CATH AND CORONARY ANGIOGRAPHY N/A 12/26/2016   Procedure: LEFT HEART CATH AND CORONARY ANGIOGRAPHY;  Surgeon: Sydney Man, MD;  Location: [facility] CV LAB;  Service: Cardiovascular: Tandem 50-55% lesions in the proximal and mid LAD. Evaluated with FFR  . TEE WITHOUT CARDIOVERSION N/A 05/08/2016   Procedure: TRANSESOPHAGEAL ECHOCARDIOGRAM (TEE);  Surgeon:  Dixie Dials, MD; normal LV size EF 55%. No RWMA. No atrial or appendage thrombus. No PFO or ASD.  Sydney Herring TRANSTHORACIC ECHOCARDIOGRAM  07/2016   EF 55-60%. Nl DD. Trivial MR. Mild Aortic Sclerosis  . WISDOM TOOTH EXTRACTION     Cath 12/2016 -- tandem 50-55% p&m LAD lesions - FFR 0.86 -->is not physiologic significant.     MEDICATIONS/ALLERGIES   No outpatient medications have been marked as taking for the 06/02/19 encounter (Office Visit) with Sydney Man, MD.    No Known Allergies   SOCIAL HISTORY/FAMILY HISTORY   Social History   Tobacco Use  . Smoking status: Former Smoker    Quit date: 08/19/2015    Years since quitting: 3.7  . Smokeless tobacco: Never Used  Substance Use Topics  . Alcohol use: Yes    Alcohol/week: 0.0 standard drinks    Comment: social  . Drug use: No   Social History   Social History Narrative   Lives with husband    Family History family history includes Diabetes in her mother; Heart attack in her mother; Stroke in her mother.   OBJCTIVE -PE, EKG, labs   Wt Readings from Last 3 Encounters:  06/02/19 155 lb 6.4 oz (70.5 kg)  03/18/17 164 lb 6.4 oz (74.6 kg)  01/23/17 160 lb (72.6 kg)    Physical Exam: BP 127/85   Pulse 79   Temp (!) 97.3 F (36.3 C)   Ht 5\' 4"  (1.626 m)   Wt 155 lb 6.4 oz (70.5 kg)   SpO2 97%   BMI 26.67 kg/m  Physical Exam  Constitutional: She is oriented to person, place, and time. She appears well-developed and well-nourished. No distress.  HENT:  Head: Normocephalic and atraumatic.  Neck: No JVD present. Carotid bruit is not present. No thyromegaly present.  Cardiovascular: Normal rate, regular rhythm, S1 normal, S2 normal and intact distal pulses.  No extrasystoles are present. PMI is not displaced. Exam reveals no gallop and no friction rub.  Murmur (Very soft SEM at RUSB.  Barely audible) heard. Pulmonary/Chest: Effort normal and breath sounds normal. No respiratory distress. She has no wheezes. She has no  rales.  Musculoskeletal:        General: No edema. Normal range of motion.     Cervical back: Normal range of motion and neck supple.  Neurological: She is alert and oriented to person, place, and time.  Psychiatric: She has a normal mood and affect. Her behavior is normal. Judgment and thought content normal.  Vitals reviewed.   Adult ECG Report  Rate: 79 ;  Rhythm: normal sinus rhythm and Normal axis, intervals durations.;   Narrative Interpretation: Normal EKG  Recent Labs: (In Care Everywhere) November 2020: TC 141, TG 107, HDL 55, LDL direct 70.  Creatinine 0.82.  ASSESSMENT/PLAN    Problem List Items Addressed This Visit    Frequent multifocal PVCs (Chronic)    Pretty much asymptomatic.  Is not on beta-blocker, if you were to have more symptoms, would consider adding beta-blocker.      Relevant Orders   EKG 12-Lead   Hyperlipidemia with target low density lipoprotein (LDL) cholesterol less than 70 mg/dL (Chronic)   Essential hypertension (Chronic)    Blood pressure well controlled on  current meds.  She is only on low-dose amlodipine which would be for microvascular disease and for possible spasm.      Coronary artery disease, non-occlusive - Primary (Chronic)    Moderate disease in the LAD. Need to ensure progressive despite medication.  Her lipids look well-controlled as is her blood pressure.  She is on Metformin with an A1c of 7.8.  Could consider SGLT2 inhibitor such as [place] or Jardiance.      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  Consider Marcelline Deist or Jardiance type medication.      Exertional dyspnea    Most likely not related to obstructive coronary disease.  We added amlodipine for possible microvascular disease.  Overall she seems to be stable and doing well.      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  No bleeding      Splenic infarct    Remains on Xarelto.  She is also headache "stroke" type symptoms in the past..  Suggested if any other episodes recur we would consider loop recorder accounted for now she remains on Xarelto.  Has not had symptoms to suggest A. fib.          COVID-19 Education: The signs and symptoms of COVID-19 were discussed with the patient and how to seek care for testing (follow up with PCP or arrange E-visit).   The importance of social distancing was discussed today.  I spent a total of with the patient and chart review. >  50% of the time was spent in direct patient consultation.  Additional time spent with chart review (studies, outside notes, etc): 8 Total Time: 25 min   Current medicines are reviewed at length with the patient today.  (+/- concerns) none   Patient Instructions / Medication Changes & Studies & Tests Ordered   Patient Instructions  Medication Instructions:  No change *If you need a refill on your cardiac medications before your next appointment, please call your pharmacy*  Lab Work: Not needed  Testing/Procedures: Not needed  Follow-Up: At [facility], you and your health needs are our priority.  As part of our continuing mission to provide you with exceptional heart care, we have created designated Provider Care Teams.  These Care Teams include your primary Cardiologist (physician) and Advanced Practice Providers (APPs -  Physician Assistants and Nurse Practitioners) who all work together to provide you with the care you need, when you need it.  Your next appointment:   12 month(s)  The format for your next appointment:   In Person  Provider:   Bryan Lemma, MD  Other Instructions     Studies Ordered:   Orders Placed This Encounter  Procedures  . EKG 12-Lead     Bryan Lemma, M.D., M.S. Interventional Cardiologist   Pager # [phone] Phone #  [phone] [address] [place], [place] [address]   Thank you for choosing Heart[facility] at [facility]!!

## 2019-06-04 ENCOUNTER — Encounter: Payer: Self-pay | Admitting: Cardiology

## 2019-06-04 NOTE — Assessment & Plan Note (Signed)
Moderate disease in the LAD. Need to ensure progressive despite medication.  Her lipids look well-controlled as is her blood pressure.  She is on Metformin with an A1c of 7.8.  Could consider SGLT2 inhibitor such as [place] or Jardiance.

## 2019-06-04 NOTE — Assessment & Plan Note (Signed)
Blood pressure well controlled on current meds.  She is only on low-dose amlodipine which would be for microvascular disease and for possible spasm.

## 2019-06-04 NOTE — Assessment & Plan Note (Signed)
Remains on Xarelto.  She is also headache "stroke" type symptoms in the past..  Suggested if any other episodes recur we would consider loop recorder accounted for now she remains on Xarelto.  Has not had symptoms to suggest A. fib.

## 2019-06-04 NOTE — Assessment & Plan Note (Signed)
Most likely not related to obstructive coronary disease.  We added amlodipine for possible microvascular disease.  Overall she seems to be stable and doing well.

## 2019-06-04 NOTE — Assessment & Plan Note (Signed)
With chronic venous insufficiency and recurrent DVT, she is on long-term Xarelto.  No bleeding

## 2019-06-04 NOTE — Assessment & Plan Note (Signed)
As noted above.  Consider Marcelline Deist or Jardiance type medication.

## 2019-06-04 NOTE — Assessment & Plan Note (Signed)
Pretty much asymptomatic.  Is not on beta-blocker, if you were to have more symptoms, would consider adding beta-blocker.

## 2019-06-09 ENCOUNTER — Emergency Department (HOSPITAL_COMMUNITY): Payer: Medicare HMO

## 2019-06-09 ENCOUNTER — Inpatient Hospital Stay (HOSPITAL_COMMUNITY)
Admission: EM | Admit: 2019-06-09 | Discharge: 2019-06-11 | DRG: 310 | Disposition: A | Discharge status: Home / Self Care | Payer: Medicare HMO | Attending: Interventional Cardiology | Admitting: Interventional Cardiology

## 2019-06-09 ENCOUNTER — Other Ambulatory Visit: Payer: Self-pay

## 2019-06-09 ENCOUNTER — Encounter (HOSPITAL_COMMUNITY): Payer: Self-pay | Admitting: Interventional Cardiology

## 2019-06-09 DIAGNOSIS — I472 Ventricular tachycardia: Principal | ICD-10-CM | POA: Diagnosis present

## 2019-06-09 DIAGNOSIS — I82532 Chronic embolism and thrombosis of left popliteal vein: Secondary | ICD-10-CM | POA: Diagnosis not present

## 2019-06-09 DIAGNOSIS — Z7901 Long term (current) use of anticoagulants: Secondary | ICD-10-CM

## 2019-06-09 DIAGNOSIS — Z86718 Personal history of other venous thrombosis and embolism: Secondary | ICD-10-CM

## 2019-06-09 DIAGNOSIS — Z87891 Personal history of nicotine dependence: Secondary | ICD-10-CM | POA: Diagnosis not present

## 2019-06-09 DIAGNOSIS — Z8249 Family history of ischemic heart disease and other diseases of the circulatory system: Secondary | ICD-10-CM | POA: Diagnosis not present

## 2019-06-09 DIAGNOSIS — I87009 Postthrombotic syndrome without complications of unspecified extremity: Secondary | ICD-10-CM | POA: Diagnosis present

## 2019-06-09 DIAGNOSIS — Z833 Family history of diabetes mellitus: Secondary | ICD-10-CM | POA: Diagnosis not present

## 2019-06-09 DIAGNOSIS — E785 Hyperlipidemia, unspecified: Secondary | ICD-10-CM | POA: Diagnosis present

## 2019-06-09 DIAGNOSIS — E1151 Type 2 diabetes mellitus with diabetic peripheral angiopathy without gangrene: Secondary | ICD-10-CM | POA: Diagnosis present

## 2019-06-09 DIAGNOSIS — D735 Infarction of spleen: Secondary | ICD-10-CM | POA: Diagnosis present

## 2019-06-09 DIAGNOSIS — F17201 Nicotine dependence, unspecified, in remission: Secondary | ICD-10-CM

## 2019-06-09 DIAGNOSIS — I493 Ventricular premature depolarization: Secondary | ICD-10-CM | POA: Diagnosis present

## 2019-06-09 DIAGNOSIS — Z823 Family history of stroke: Secondary | ICD-10-CM | POA: Diagnosis not present

## 2019-06-09 DIAGNOSIS — I4892 Unspecified atrial flutter: Secondary | ICD-10-CM | POA: Diagnosis present

## 2019-06-09 DIAGNOSIS — Z7982 Long term (current) use of aspirin: Secondary | ICD-10-CM

## 2019-06-09 DIAGNOSIS — K219 Gastro-esophageal reflux disease without esophagitis: Secondary | ICD-10-CM | POA: Diagnosis present

## 2019-06-09 DIAGNOSIS — Z20822 Contact with and (suspected) exposure to covid-19: Secondary | ICD-10-CM | POA: Diagnosis present

## 2019-06-09 DIAGNOSIS — Z7984 Long term (current) use of oral hypoglycemic drugs: Secondary | ICD-10-CM | POA: Diagnosis not present

## 2019-06-09 DIAGNOSIS — I10 Essential (primary) hypertension: Secondary | ICD-10-CM

## 2019-06-09 DIAGNOSIS — J449 Chronic obstructive pulmonary disease, unspecified: Secondary | ICD-10-CM | POA: Diagnosis present

## 2019-06-09 DIAGNOSIS — Z79899 Other long term (current) drug therapy: Secondary | ICD-10-CM | POA: Diagnosis not present

## 2019-06-09 DIAGNOSIS — I2510 Atherosclerotic heart disease of native coronary artery without angina pectoris: Secondary | ICD-10-CM | POA: Diagnosis present

## 2019-06-09 DIAGNOSIS — I639 Cerebral infarction, unspecified: Secondary | ICD-10-CM | POA: Diagnosis present

## 2019-06-09 DIAGNOSIS — E119 Type 2 diabetes mellitus without complications: Secondary | ICD-10-CM | POA: Diagnosis not present

## 2019-06-09 DIAGNOSIS — Z8673 Personal history of transient ischemic attack (TIA), and cerebral infarction without residual deficits: Secondary | ICD-10-CM

## 2019-06-09 DIAGNOSIS — R000 Tachycardia, unspecified: Secondary | ICD-10-CM

## 2019-06-09 HISTORY — DX: Coagulation defect, unspecified: D68.9

## 2019-06-09 HISTORY — DX: Cardiac arrhythmia, unspecified: I49.9

## 2019-06-09 LAB — COMPREHENSIVE METABOLIC PANEL
ALT: 27 U/L (ref 0–44)
AST: 20 U/L (ref 15–41)
Albumin: 4.1 g/dL (ref 3.5–5.0)
Alkaline Phosphatase: 72 U/L (ref 38–126)
Anion gap: 11 (ref 5–15)
BUN: 22 mg/dL (ref 8–23)
CO2: 24 mmol/L (ref 22–32)
Calcium: 9.5 mg/dL (ref 8.9–10.3)
Chloride: 106 mmol/L (ref 98–111)
Creatinine, Ser: 0.92 mg/dL (ref 0.44–1.00)
GFR calc Af Amer: >60 mL/min (ref >60)
GFR calc non Af Amer: >60 mL/min (ref >60)
Glucose, Bld: 125 mg/dL — ABNORMAL HIGH (ref 70–99)
Potassium: 4.8 mmol/L (ref 3.5–5.1)
Sodium: 141 mmol/L (ref 135–145)
Total Bilirubin: 0.6 mg/dL (ref 0.3–1.2)
Total Protein: 7.2 g/dL (ref 6.5–8.1)

## 2019-06-09 LAB — BASIC METABOLIC PANEL
Anion gap: 14 (ref 5–15)
BUN: 24 mg/dL — ABNORMAL HIGH (ref 8–23)
CO2: 24 mmol/L (ref 22–32)
Calcium: 9.4 mg/dL (ref 8.9–10.3)
Chloride: 101 mmol/L (ref 98–111)
Creatinine, Ser: 1.08 mg/dL — ABNORMAL HIGH (ref 0.44–1.00)
GFR calc Af Amer: >60 mL/min (ref >60)
GFR calc non Af Amer: 53 mL/min — ABNORMAL LOW (ref >60)
Glucose, Bld: 183 mg/dL — ABNORMAL HIGH (ref 70–99)
Potassium: 4.1 mmol/L (ref 3.5–5.1)
Sodium: 139 mmol/L (ref 135–145)

## 2019-06-09 LAB — HIV ANTIBODY (ROUTINE TESTING W REFLEX): HIV Screen 4th Generation wRfx: NONREACTIVE

## 2019-06-09 LAB — RESPIRATORY PANEL BY RT PCR (FLU A&B, COVID)
Influenza A by PCR: NEGATIVE
Influenza B by PCR: NEGATIVE
SARS Coronavirus 2 by RT PCR: NEGATIVE

## 2019-06-09 LAB — I-STAT CHEM 8, ED
BUN: 39 mg/dL — ABNORMAL HIGH (ref 8–23)
Calcium, Ion: 1.03 mmol/L — ABNORMAL LOW (ref 1.15–1.40)
Chloride: 104 mmol/L (ref 98–111)
Creatinine, Ser: 1 mg/dL (ref 0.44–1.00)
Glucose, Bld: 178 mg/dL — ABNORMAL HIGH (ref 70–99)
HCT: 44 % (ref 36.0–46.0)
Hemoglobin: 15 g/dL (ref 12.0–15.0)
Potassium: 7.2 mmol/L (ref 3.5–5.1)
Sodium: 136 mmol/L (ref 135–145)
TCO2: 26 mmol/L (ref 22–32)

## 2019-06-09 LAB — CBC
HCT: 40.2 % (ref 36.0–46.0)
Hemoglobin: 12.9 g/dL (ref 12.0–15.0)
MCH: 29.4 pg (ref 26.0–34.0)
MCHC: 32.1 g/dL (ref 30.0–36.0)
MCV: 91.6 fL (ref 80.0–100.0)
Platelets: 325 10*3/uL (ref 150–400)
RBC: 4.39 MIL/uL (ref 3.87–5.11)
RDW: 14.2 % (ref 11.5–15.5)
WBC: 5 10*3/uL (ref 4.0–10.5)
nRBC: 0 % (ref 0.0–0.2)

## 2019-06-09 LAB — MAGNESIUM: Magnesium: 1.7 mg/dL (ref 1.7–2.4)

## 2019-06-09 LAB — TROPONIN I (HIGH SENSITIVITY)
Troponin I (High Sensitivity): <2 ng/L (ref <18)
Troponin I (High Sensitivity): 4 ng/L (ref <18)

## 2019-06-09 LAB — BRAIN NATRIURETIC PEPTIDE: B Natriuretic Peptide: 61 pg/mL (ref 0.0–100.0)

## 2019-06-09 LAB — TSH: TSH: 1.76 u[IU]/mL (ref 0.350–4.500)

## 2019-06-09 LAB — HEMOGLOBIN A1C
Hgb A1c MFr Bld: 8.3 % — ABNORMAL HIGH (ref 4.8–5.6)
Mean Plasma Glucose: 191.51 mg/dL

## 2019-06-09 MED ORDER — ALBUTEROL SULFATE HFA 108 (90 BASE) MCG/ACT IN AERS: 2.0000 | INHALATION_SPRAY | RESPIRATORY_TRACT | Status: DC | PRN

## 2019-06-09 MED ORDER — ACETAMINOPHEN 325 MG PO TABS: 650.0000 mg | ORAL_TABLET | ORAL | Status: DC | PRN

## 2019-06-09 MED ORDER — METFORMIN HCL ER 500 MG PO TB24: 500.0000 mg | ORAL_TABLET | Freq: Four times a day (QID) | ORAL | Status: DC

## 2019-06-09 MED ORDER — METOPROLOL SUCCINATE ER 25 MG PO TB24
25.0000 mg | ORAL_TABLET | Freq: Every day | ORAL | Status: DC
  Administered 2019-06-09 – 2019-06-10 (×2): 25 mg via ORAL
  Filled 2019-06-09 (×2): qty 1

## 2019-06-09 MED ORDER — PANTOPRAZOLE SODIUM 20 MG PO TBEC
20.0000 mg | DELAYED_RELEASE_TABLET | Freq: Every day | ORAL | Status: DC
  Administered 2019-06-09 – 2019-06-11 (×3): 20 mg via ORAL
  Filled 2019-06-09 (×4): qty 1

## 2019-06-09 MED ORDER — METFORMIN HCL 500 MG PO TABS
500.0000 mg | ORAL_TABLET | Freq: Four times a day (QID) | ORAL | Status: DC
  Administered 2019-06-09 – 2019-06-10 (×2): 500 mg via ORAL
  Filled 2019-06-09 (×2): qty 1

## 2019-06-09 MED ORDER — ATORVASTATIN CALCIUM 10 MG PO TABS
10.0000 mg | ORAL_TABLET | Freq: Every day | ORAL | Status: DC
  Administered 2019-06-10 – 2019-06-11 (×2): 10 mg via ORAL
  Filled 2019-06-09 (×2): qty 1

## 2019-06-09 MED ORDER — ALBUTEROL SULFATE (2.5 MG/3ML) 0.083% IN NEBU: 2.5000 mg | INHALATION_SOLUTION | RESPIRATORY_TRACT | Status: DC | PRN

## 2019-06-09 MED ORDER — ONDANSETRON HCL 4 MG/2ML IJ SOLN: 4.0000 mg | Freq: Four times a day (QID) | INTRAMUSCULAR | Status: DC | PRN

## 2019-06-09 MED ORDER — FLUTICASONE FUROATE-VILANTEROL 200-25 MCG/INH IN AEPB
1.0000 | INHALATION_SPRAY | Freq: Every day | RESPIRATORY_TRACT | Status: DC
  Administered 2019-06-10 – 2019-06-11 (×2): 1 via RESPIRATORY_TRACT
  Filled 2019-06-09: qty 28

## 2019-06-09 MED ORDER — ACETAMINOPHEN 325 MG PO TABS: 650.0000 mg | ORAL_TABLET | Freq: Four times a day (QID) | ORAL | Status: DC | PRN

## 2019-06-09 MED ORDER — RIVAROXABAN 20 MG PO TABS
20.0000 mg | ORAL_TABLET | Freq: Every day | ORAL | Status: DC
  Administered 2019-06-09 – 2019-06-10 (×2): 20 mg via ORAL
  Filled 2019-06-09 (×2): qty 1

## 2019-06-09 NOTE — ED Provider Notes (Signed)
[facility] EMERGENCY DEPARTMENT Provider Note   CSN: [id number] Arrival date & time: 06/09/19  1626     History Chief Complaint  Patient presents with  . Shortness of Breath  . Tachycardia    Sydney Herring is a 69 y.o. female.  Pt presents to the ED today with sob and palpitations.  She said the palpitations started today.  She's had no cp.  She does have a hx of PVCs, but has never been symptomatic from it.  She is not on a beta blocker.  She does have a hx of non-occlusive CAD with moderate disease in the LAD (cath 12/2016).  The pt is also a diabetic and has had an A1c of 7.8.  Pt said her blood sugars have been ok.  The pt is on Xarelto for a hx of a stroke.  The pt has no hx of afib.  The pt does see Dr. Herbie Baltimore (cards) and saw him on 1/13.  EKG then was nsr.             Past Medical History:  Diagnosis Date  . Arrhythmia    Palpitations  . Clotting disorder (HCC)    Recurrent DVT  . COPD (chronic obstructive pulmonary disease) (HCC)   . 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 Evaluated with FFR (0.86) - Not physiologically significant.  . CVA (cerebral vascular accident) (HCC) 07/2016  . Diabetes mellitus without complication (HCC)   . DVT (deep venous thrombosis) (HCC)   . Gastroesophageal reflux   . Hyperlipidemia   . Hypertension   . Peripheral vascular disease (HCC)   . 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 Vitamin D deficiency     Patient Active Problem List   Diagnosis Date Noted  . Wide-complex tachycardia (HCC) 06/09/2019  . Coronary artery disease, non-occlusive 06/02/2019  . Abnormal cardiac CT angiography 12/26/2016  . Abnormal cardiovascular function study   . Exertional dyspnea 11/22/2016  . Stroke (cerebrum) (HCC)   . TIA (transient ischemic attack) 08/01/2016  . Frequent multifocal PVCs 08/01/2016  . Dehydration 08/01/2016  . 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  Diabetes mellitus type 2 in obese (HCC) 05/07/2016  . COPD (chronic obstructive pulmonary disease) (HCC) 05/07/2016  . Hyperlipidemia with target low density lipoprotein (LDL) cholesterol less than 70 mg/dL 38/88/2800  . 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 Essential hypertension 05/07/2016  . Diabetes mellitus with complication (HCC)   . 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 Chronic venous insufficiency 11/03/2015    Past Surgical History:  Procedure Laterality Date  . ELBOW SURGERY    . EYE SURGERY     bilat cataracts removed  . INTRAVASCULAR PRESSURE WIRE/FFR STUDY N/A 12/26/2016   Procedure: INTRAVASCULAR PRESSURE WIRE/FFR STUDY;  Surgeon: Marykay Lex, MD;  Location: [facility] INVASIVE CV LAB;  Service: Cardiovascular;: FFR obtained lesions in the LAD. Coronary CTA suggested positive FFR. Final invasive FFR was 0.86.  [name] LEFT HEART CATH AND CORONARY ANGIOGRAPHY N/A 12/26/2016   Procedure: LEFT HEART CATH AND CORONARY ANGIOGRAPHY;  Surgeon: Marykay Lex, MD;  Location: [facility] INVASIVE CV LAB;  Service: Cardiovascular: Tandem 50-55% lesions in the proximal and mid LAD. Evaluated with FFR  . TEE WITHOUT CARDIOVERSION N/A 05/08/2016   Procedure: TRANSESOPHAGEAL ECHOCARDIOGRAM (TEE);  Surgeon: Orpah Cobb, MD; normal LV size EF 55%. No RWMA. No atrial or appendage  thrombus. No PFO or ASD.  [name] TRANSTHORACIC ECHOCARDIOGRAM  07/2016   EF 55-60%. Nl DD. Trivial MR. Mild Aortic Sclerosis  . WISDOM TOOTH EXTRACTION       OB History   No obstetric history on file.     Family History  Problem Relation Age of Onset  . Diabetes Mother   . Stroke Mother   . Heart attack Mother     Social History   Tobacco Use  . Smoking status: Former Smoker    Quit date: 08/19/2015    Years since quitting: 3.8  . Smokeless tobacco: Never Used  Substance Use Topics  . Alcohol use: Yes    Alcohol/week: 0.0  standard drinks    Comment: social  . Drug use: No    Home Medications Prior to Admission medications   Medication Sig Start Date End Date Taking? Authorizing Provider  acetaminophen (TYLENOL) 325 MG tablet Take 650 mg by mouth every 6 (six) hours as needed for mild pain.    [provider]  albuterol (PROVENTIL HFA) 108 (90 Base) MCG/ACT inhaler Inhale 2 puffs into the lungs every 4 (four) hours as needed for wheezing or shortness of breath.    [provider]  amLODipine (NORVASC) 2.5 MG tablet TAKE 1 TABLET BY MOUTH EVERY DAY 11/28/16   [provider]  aspirin 81 MG chewable tablet Chew 1 tablet (81 mg total) by mouth daily. 08/04/16   Velvet Bathe, MD  atorvastatin (LIPITOR) 10 MG tablet Take 10 mg by mouth daily.    [provider]  FIBER PO Take 1 capsule by mouth daily.    [provider]  Fluticasone-Salmeterol (ADVAIR) 250-50 MCG/DOSE AEPB Inhale 1 puff into the lungs 2 (two) times daily as needed (for shortness of breath).     [provider]  lansoprazole (PREVACID) 15 MG capsule Take 15 mg by mouth daily. 04/21/16   [provider]  MEGARED OMEGA-3 KRILL OIL 500 MG CAPS Take 500 mg by mouth daily.    [provider]  metFORMIN (GLUCOPHAGE-XR) 750 MG 24 hr tablet Take 500 mg by mouth QID.     [provider]  rivaroxaban (XARELTO) 20 MG TABS tablet Take 20 mg by mouth every evening.  05/15/16   [provider]    Allergies    Patient has no known allergies.  Review of Systems   Review of Systems  Respiratory: Positive for shortness of breath.   Cardiovascular: Positive for palpitations.  All other systems reviewed and are negative.   Physical Exam Updated Vital Signs BP (!) 141/80   Pulse 97   Temp 98 F (36.7 C)   Resp 17   SpO2 97%   Physical Exam Vitals and nursing note reviewed.  Constitutional:      Appearance: She is well-developed.  HENT:     Head: Normocephalic and  atraumatic.     Mouth/Throat:     Mouth: Mucous membranes are moist.     Pharynx: Oropharynx is clear.  Eyes:     Extraocular Movements: Extraocular movements intact.     Pupils: Pupils are equal, round, and reactive to light.  Cardiovascular:     Rate and Rhythm: Regular rhythm. Tachycardia present.  Pulmonary:     Effort: Pulmonary effort is normal.     Breath sounds: Normal breath sounds.  Abdominal:     General: Bowel sounds are normal.     Palpations: Abdomen is soft.  Musculoskeletal:  General: Normal range of motion.     Cervical back: Normal range of motion and neck supple.  Skin:    General: Skin is warm and dry.     Capillary Refill: Capillary refill takes less than 2 seconds.  Neurological:     General: No focal deficit present.     Mental Status: She is alert and oriented to person, place, and time.  Psychiatric:        Mood and Affect: Mood normal.        Behavior: Behavior normal.     ED Results / Procedures / Treatments   Labs (all labs ordered are listed, but only abnormal results are displayed) Labs Reviewed  BASIC METABOLIC PANEL - Abnormal; Notable for the following components:      Result Value   Glucose, Bld 183 (*)    BUN 24 (*)    Creatinine, Ser 1.08 (*)    GFR calc non Af Amer 53 (*)    All other components within normal limits  I-STAT CHEM 8, ED - Abnormal; Notable for the following components:   Potassium 7.2 (*)    BUN 39 (*)    Glucose, Bld 178 (*)    Calcium, Ion 1.03 (*)    All other components within normal limits  RESPIRATORY PANEL BY RT PCR (FLU A&B, COVID)  CBC  TROPONIN I (HIGH SENSITIVITY)  TROPONIN I (HIGH SENSITIVITY)    EKG EKG Interpretation  Date/Time:  Wednesday June 09 2019 18:12:23 EST Ventricular Rate:  97 PR Interval:    QRS Duration: 103 QT Interval:  381 QTC Calculation: 484 R Axis:   41 Text Interpretation: Sinus rhythm Borderline T abnormalities, anterior leads wide complex tachycardia resolved  spontaneously Confirmed by Jacalyn Lefevre [phone]) on 06/09/2019 6:23:55 PM   Radiology DG Chest Port 1 View  Result Date: 06/09/2019 CLINICAL DATA:  Shortness of breath. Additional history provided by technologist: Patient reports sudden onset shortness of breath, recent catheterization procedure. EXAM: PORTABLE CHEST 1 VIEW COMPARISON:  CT angiogram chest 05/09/2016, report from chest radiograph 12/03/2010 (images unavailable) FINDINGS: Heart size within normal limits. No airspace consolidation within the lungs. No pleural effusion or pneumothorax. No acute bony abnormality. Overlying cardiac monitoring leads. IMPRESSION: No evidence of acute cardiopulmonary abnormality. Electronically Signed   By: Jackey Loge DO   On: 06/09/2019 17:41    Procedures Procedures (including critical care time)  Medications Ordered in ED Medications - No data to display  ED Course  I have reviewed the triage vital signs and the nursing notes.  Pertinent labs & imaging results that were available during my care of the patient were reviewed by me and considered in my medical decision making (see chart for details).    MDM Rules/Calculators/A&P                     EKG grossly abnormal, so I called cardiology to see what they thought.  They don't want to call a STEMI as she is without cp. They will come see her.  I-stat K is elevated, but pt is not a dialysis pt or on any K-sparing meds.  I suspect hemolysis.  BMP with nl K.  Pt was seen by Dr. Katrinka Blazing (cards) who will admit.  Her wide complex tachycardia resolved spontaneously.  He will start her on a low dose beta blocker and d/w EP.  Pt is feeling much better now that her HR is down to nl.  CRITICAL CARE Performed by: Raynelle Fanning  Allister Lessley   Total critical care time: 30 minutes  Critical care time was exclusive of separately billable procedures and treating other patients.  Critical care was necessary to treat or prevent imminent or life-threatening  deterioration.  Critical care was time spent personally by me on the following activities: development of treatment plan with patient and/or surrogate as well as nursing, discussions with consultants, evaluation of patient's response to treatment, examination of patient, obtaining history from patient or surrogate, ordering and performing treatments and interventions, ordering and review of laboratory studies, ordering and review of radiographic studies, pulse oximetry and re-evaluation of patient's condition.   Final Clinical Impression(s) / ED Diagnoses Final diagnoses:  Wide-complex tachycardia [facility])    Rx / DC Orders ED Discharge Orders    None       Jacalyn Lefevre, MD 06/09/19 1825

## 2019-06-09 NOTE — ED Triage Notes (Signed)
Pt reports sudden onset SOB while out in the yard today, had recent cath procedure done last week with no interventions. Pt denies chest pain. Pt alert, oriented. Tachycardic 140s, wide QRS complex on EKG

## 2019-06-09 NOTE — ED Notes (Signed)
PIVC placed on the right hand which flushed without pain or infiltration. Xray at bedside.

## 2019-06-09 NOTE — H&P (Signed)
Cardiology Admission History and Physical:   Patient ID: Sydney Herring MRN: [id number]; DOB: 1951/02/26   Admission date: 06/09/2019  Primary Care Provider: Richmond Campbell., PA-C Primary Cardiologist: Bryan Lemma, MD Primary Electrophysiologist:  None   Chief Complaint: Racing heart and weakness  Patient Profile:   Sydney Herring is a 69 y.o. female with nonobstructive CAD, recurrent DVT, chronic Xarelto therapy, coronary artery calcification on CT, COPD, essential hypertension, history of splenic infarct, and type 2 diabetes mellitus.  She came to the emergency room because she had sudden onset of rapid heartbeat that caused her to feel weak and dizzy.  History of Present Illness:   Sydney Herring the patient has a history of palpitations that are due to multifocal PVCs.  She has never had a sustained arrhythmia.  Yesterday for approximately 15 minutes she had sudden increase in heart rate because of weakness and fatigue.  It was precipitated by physical activity.  Again today while helping her husband move some objects from their truck, she had sudden onset of rapid heart beating, weakness, dizziness, and decided to come to the emergency room when it persisted.  In the emergency room the EKG revealed a wide complex tachycardia at approximately 148 bpm with a right bundle left anterior hemiblock appearance.  Flutter waves could not be definitively identified.  By the time I arrived in the emergency room to see the patient, the heart rate suddenly resolved to normal less than 100 bpm.  The patient now feels better.  She is having occasional palpitations noted to be PVCs on monitor.  Heart Pathway Score:     Past Medical History:  Diagnosis Date  . Arrhythmia    Palpitations  . Clotting disorder (HCC)    Recurrent DVT  . COPD (chronic obstructive pulmonary disease) (HCC)   . 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  Evaluated with FFR (0.86) - Not physiologically significant.  . CVA (cerebral vascular accident) (HCC) 07/2016  . Diabetes mellitus without complication (HCC)   . DVT (deep venous thrombosis) (HCC)   . Gastroesophageal reflux   . Hyperlipidemia   . Hypertension   . Peripheral vascular disease (HCC)   . 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 Vitamin D deficiency     Past Surgical History:  Procedure Laterality Date  . ELBOW SURGERY    . EYE SURGERY     bilat cataracts removed  . INTRAVASCULAR PRESSURE WIRE/FFR STUDY N/A 12/26/2016   Procedure: INTRAVASCULAR PRESSURE WIRE/FFR STUDY;  Surgeon: Marykay Lex, MD;  Location: [facility] INVASIVE CV LAB;  Service: Cardiovascular;: FFR obtained lesions in the LAD. Coronary CTA suggested positive FFR. Final invasive FFR was 0.86.  [name] LEFT HEART CATH AND CORONARY ANGIOGRAPHY N/A 12/26/2016   Procedure: LEFT HEART CATH AND CORONARY ANGIOGRAPHY;  Surgeon: Marykay Lex, MD;  Location: [facility] INVASIVE CV LAB;  Service: Cardiovascular: Tandem 50-55% lesions in the proximal and mid LAD. Evaluated with FFR  . TEE WITHOUT CARDIOVERSION N/A 05/08/2016   Procedure: TRANSESOPHAGEAL ECHOCARDIOGRAM (TEE);  Surgeon: Orpah Cobb, MD; normal LV size EF 55%. No RWMA. No atrial or appendage thrombus. No PFO or ASD.  [name] TRANSTHORACIC ECHOCARDIOGRAM  07/2016   EF 55-60%. Nl DD. Trivial MR. Mild Aortic Sclerosis  . WISDOM TOOTH EXTRACTION       Medications Prior to Admission: Prior to Admission medications   Medication Sig Start Date End Date Taking? Authorizing Provider  acetaminophen (TYLENOL) 325 MG tablet Take 650  mg by mouth every 6 (six) hours as needed for mild pain.    [provider]  albuterol (PROVENTIL HFA) 108 (90 Base) MCG/ACT inhaler Inhale 2 puffs into the lungs every 4 (four) hours as needed for wheezing or shortness of breath.    [provider]  amLODipine (NORVASC) 2.5 MG tablet TAKE 1 TABLET BY MOUTH EVERY DAY 11/28/16   [provider]    aspirin 81 MG chewable tablet Chew 1 tablet (81 mg total) by mouth daily. 08/04/16   Penny Pia, MD  atorvastatin (LIPITOR) 10 MG tablet Take 10 mg by mouth daily.    [provider]  FIBER PO Take 1 capsule by mouth daily.    [provider]  Fluticasone-Salmeterol (ADVAIR) 250-50 MCG/DOSE AEPB Inhale 1 puff into the lungs 2 (two) times daily as needed (for shortness of breath).     [provider]  lansoprazole (PREVACID) 15 MG capsule Take 15 mg by mouth daily. 04/21/16   [provider]  MEGARED OMEGA-3 KRILL OIL 500 MG CAPS Take 500 mg by mouth daily.    [provider]  metFORMIN (GLUCOPHAGE-XR) 750 MG 24 hr tablet Take 500 mg by mouth QID.     [provider]  rivaroxaban (XARELTO) 20 MG TABS tablet Take 20 mg by mouth every evening.  05/15/16   [provider]     Allergies:   No Known Allergies  Social History:   Social History   Socioeconomic History  . Marital status: Married    Spouse name: Smitty Cords  . Number of children: 0  . Years of education: 18  . Highest education level: Not on file  Occupational History    Comment: retired  Tobacco Use  . Smoking status: Former Smoker    Quit date: 08/19/2015    Years since quitting: 3.8  . Smokeless tobacco: Never Used  Substance and Sexual Activity  . Alcohol use: Yes    Alcohol/week: 0.0 standard drinks    Comment: social  . Drug use: No  . Sexual activity: Not on file  Other Topics Concern  . Not on file  Social History Narrative   Lives with husband   Social Determinants of Health   Financial Resource Strain:   . Difficulty of Paying Living Expenses: Not on file  Food Insecurity:   . Worried About Programme researcher, broadcasting/film/video in the Last Year: Not on file  . Ran Out of Food in the Last Year: Not on file  Transportation Needs:   . Lack of Transportation (Medical): Not on file  . Lack of Transportation (Non-Medical): Not on file  Physical Activity:   . Days of  Exercise per Week: Not on file  . Minutes of Exercise per Session: Not on file  Stress:   . Feeling of Stress : Not on file  Social Connections:   . Frequency of Communication with Friends and Family: Not on file  . Frequency of Social Gatherings with Friends and Family: Not on file  . Attends Religious Services: Not on file  . Active Member of Clubs or Organizations: Not on file  . Attends Banker Meetings: Not on file  . Marital Status: Not on file  Intimate Partner Violence:   . Fear of Current or Ex-Partner: Not on file  . Emotionally Abused: Not on file  . Physically Abused: Not on file  . Sexually Abused: Not on file    Family History:   The patient's family history  includes Diabetes in her mother; Heart attack in her mother; Stroke in her mother.    ROS:  Please see the history of present illness.  Does not currently smoke.  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  She has had a prior CVA.  Chronic Xarelto therapy has been continued.  A work-up for thrombophilia has been done but she has no recollection or data concerning the findings.  She is physically quite active.  Never had syncope.  No new neurological complaints.  No chest pain associated.  At baseline has occasional palpitation.  No bleeding on Xarelto.  All other ROS reviewed and negative.     Physical Exam/Data:   Vitals:   06/09/19 1636 06/09/19 1645 06/09/19 1711 06/09/19 1715  BP: 134/89 (!) 155/87  (!) 143/86  Pulse: (!) 145 87 (!) 102 100  Resp: 18 (!) 21  (!) 21  Temp: 98 F (36.7 C)     SpO2: 100% 100%  98%   No intake or output data in the 24 hours ending 06/09/19 1745 Last 3 Weights 06/02/2019 03/18/2017 01/23/2017  Weight (lbs) 155 lb 6.4 oz 164 lb 6.4 oz 160 lb  Weight (kg) 70.489 kg 74.571 kg 72.576 kg     There is no height or weight on file to calculate BMI.  General:  Well nourished, well developed, in no acute distress lying comfortable on the  gurney HEENT: normal Lymph: no adenopathy Neck: no JVD Endocrine:  No thryomegaly Vascular: No carotid bruits; FA pulses 2+ bilaterally without bruits  Cardiac:  normal S1, S2; RRR; no murmur. 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  Prior EKG performed on January 13 was normal.  Relevant CV Studies:  Coronary angiography 2018 revealed nonobstructive CAD.  Echocardiogram in 2018 demonstrated normal LV function with EF 55 to 60%, calcified mitral valve annulus.  Brain MRI March 2018: IMPRESSION: 1. 6 mm focus of diffusion restriction within the right mid corona radiata compatible with acute/early subacute infarction. No associated hemorrhage. 2. 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 3. Mild chronic microvascular ischemic changes and mild parenchymal volume loss of the brain. These results will be called to the ordering clinician or representative by the Radiologist Assistant, and communication documented in the PACS or zVision Dashboard.  Laboratory Data:  High Sensitivity Troponin:  No results for input(s): TROPONINIHS in the last 720 hours.    Chemistry Recent Labs  Lab 06/09/19 1703  NA 136  K 7.2*  CL 104  GLUCOSE 178*  BUN 39*  CREATININE 1.00    No results for input(s): PROT, ALBUMIN, AST, ALT, ALKPHOS, BILITOT in the last 168 hours. Hematology Recent  Labs  Lab 06/09/19 1657 06/09/19 1703  WBC 5.0  --   RBC 4.39  --   HGB 12.9 15.0  HCT 40.2 44.0  MCV 91.6  --   MCH 29.4  --   MCHC 32.1  --  RDW 14.2  --   PLT 325  --    BNPNo results for input(s): BNP, PROBNP in the last 168 hours.  DDimer No results for input(s): DDIMER in the last 168 hours.   Radiology/Studies:  DG Chest Port 1 View  Result Date: 06/09/2019 CLINICAL DATA:  Shortness of breath. Additional history provided by technologist: Patient reports sudden onset shortness of breath, recent catheterization procedure. EXAM: PORTABLE CHEST 1 VIEW COMPARISON:  CT angiogram chest 05/09/2016, report from chest radiograph 12/03/2010 (images unavailable) FINDINGS: Heart size within normal limits. No airspace consolidation within the lungs. No pleural effusion or pneumothorax. No acute bony abnormality. Overlying cardiac monitoring leads. IMPRESSION: No evidence of acute cardiopulmonary abnormality. Electronically Signed   By: Kellie Simmering DO   On: 06/09/2019 17:41         Assessment and Plan:   1. Wide-complex tachycardia likely related to supraventricular arrhythmia such as atrial flutter with rate related conduction abnormality.  Rule out ventricular tachycardia emanating from a hemifascicle of the left bundle.  The rhythm resolved spontaneously.  She will be continued on anticoagulation therapy.  She will be admitted for observation.  We will get an opinion from the electrophysiology.  We will start low-dose beta-blocker therapy. 2. History of embolic CVA 3. History of chronic anticoagulation therapy with Xarelto 4. History of recurrent DVT 5. Hypertension    Severity of Illness: The appropriate patient status for this patient is INPATIENT. Inpatient status is judged to be reasonable and necessary in order to provide the required intensity of service to ensure the patient's safety. The patient's presenting symptoms, physical exam findings, and initial radiographic and  laboratory data in the context of their chronic comorbidities is felt to place them at high risk for further clinical deterioration. Furthermore, it is not anticipated that the patient will be medically stable for discharge from the hospital within 2 midnights of admission. The following factors support the patient status of inpatient.   " The patient's presenting symptoms include near syncope. " The worrisome physical exam findings include none. " The initial radiographic and laboratory data are worrisome because of wide-complex tachycardia. " The chronic co-morbidities include history of stroke and recurrent DVT.   * I certify that at the point of admission it is my clinical judgment that the patient will require inpatient hospital care spanning beyond 2 midnights from the point of admission due to high intensity of service, high risk for further deterioration and high frequency of surveillance required.*    For questions or updates, please contact [facility] Please consult [email or web address] for contact info under        Signed, Sinclair Grooms, MD  06/09/2019 5:45 PM

## 2019-06-09 NOTE — ED Notes (Signed)
RN to RN report given Darel Hong with no questions or concerns. Pt ready to be transported upstairs.

## 2019-06-10 ENCOUNTER — Encounter (HOSPITAL_COMMUNITY): Payer: Self-pay | Admitting: Interventional Cardiology

## 2019-06-10 ENCOUNTER — Inpatient Hospital Stay (HOSPITAL_COMMUNITY): Payer: Medicare HMO

## 2019-06-10 DIAGNOSIS — E119 Type 2 diabetes mellitus without complications: Secondary | ICD-10-CM

## 2019-06-10 DIAGNOSIS — I472 Ventricular tachycardia: Secondary | ICD-10-CM

## 2019-06-10 LAB — BASIC METABOLIC PANEL
Anion gap: 11 (ref 5–15)
BUN: 19 mg/dL (ref 8–23)
CO2: 23 mmol/L (ref 22–32)
Calcium: 8.8 mg/dL — ABNORMAL LOW (ref 8.9–10.3)
Chloride: 106 mmol/L (ref 98–111)
Creatinine, Ser: 0.86 mg/dL (ref 0.44–1.00)
GFR calc Af Amer: >60 mL/min (ref >60)
GFR calc non Af Amer: >60 mL/min (ref >60)
Glucose, Bld: 146 mg/dL — ABNORMAL HIGH (ref 70–99)
Potassium: 4.1 mmol/L (ref 3.5–5.1)
Sodium: 140 mmol/L (ref 135–145)

## 2019-06-10 LAB — POCT I-STAT, CHEM 8
BUN: 39 mg/dL — ABNORMAL HIGH (ref 8–23)
Calcium, Ion: 1.03 mmol/L — ABNORMAL LOW (ref 1.15–1.40)
Chloride: 104 mmol/L (ref 98–111)
Creatinine, Ser: 1 mg/dL (ref 0.44–1.00)
Glucose, Bld: 178 mg/dL — ABNORMAL HIGH (ref 70–99)
HCT: 44 % (ref 36.0–46.0)
Hemoglobin: 15 g/dL (ref 12.0–15.0)
Potassium: 7.2 mmol/L (ref 3.5–5.1)
Sodium: 136 mmol/L (ref 135–145)
TCO2: 26 mmol/L (ref 22–32)

## 2019-06-10 LAB — GLUCOSE, CAPILLARY
Glucose-Capillary: 128 mg/dL — ABNORMAL HIGH (ref 70–99)
Glucose-Capillary: 115 mg/dL — ABNORMAL HIGH (ref 70–99)
Glucose-Capillary: 155 mg/dL — ABNORMAL HIGH (ref 70–99)

## 2019-06-10 LAB — LIPID PANEL
Cholesterol: 116 mg/dL (ref 0–200)
HDL: 40 mg/dL — ABNORMAL LOW (ref >40)
LDL Cholesterol: 52 mg/dL (ref 0–99)
Total CHOL/HDL Ratio: 2.9 RATIO
Triglycerides: 121 mg/dL (ref <150)
VLDL: 24 mg/dL (ref 0–40)

## 2019-06-10 LAB — ECHOCARDIOGRAM COMPLETE
Height: 63 in
Weight: 2465.6 oz

## 2019-06-10 MED ORDER — METOPROLOL SUCCINATE ER 25 MG PO TB24
25.0000 mg | ORAL_TABLET | Freq: Once | ORAL | Status: AC
  Administered 2019-06-10: 25 mg via ORAL
  Filled 2019-06-10: qty 1

## 2019-06-10 MED ORDER — INSULIN ASPART 100 UNIT/ML ~~LOC~~ SOLN
0.0000 [IU] | Freq: Three times a day (TID) | SUBCUTANEOUS | Status: DC
  Administered 2019-06-11: 3 [IU] via SUBCUTANEOUS

## 2019-06-10 MED ORDER — METOPROLOL SUCCINATE ER 50 MG PO TB24
50.0000 mg | ORAL_TABLET | Freq: Every day | ORAL | Status: DC
  Administered 2019-06-11: 50 mg via ORAL
  Filled 2019-06-10: qty 1

## 2019-06-10 MED ORDER — MAGNESIUM SULFATE 2 GM/50ML IV SOLN
2.0000 g | Freq: Once | INTRAVENOUS | Status: AC
  Administered 2019-06-10: 2 g via INTRAVENOUS
  Filled 2019-06-10: qty 50

## 2019-06-10 NOTE — Progress Notes (Signed)
Pt ambulated 900 feet with RN. Vital signs were stable. Heart rate increased from 77 bpm prior to walking, 88 bpm during her walk, and down to 68 bpm upon return to her bed. Blood pressure stable. Pt denies dizziness, lightheaded feelings, or chest pain.  Thersa Salt, RN, BSN 12:53 PM 06/10/2019

## 2019-06-10 NOTE — Consult Note (Addendum)
Cardiology Consultation:   Patient ID: Sydney Herring MRN: [id number]; DOB: 01-Sep-1950  Admit date: 06/09/2019 Date of Consult: 06/10/2019  Primary Care Provider: Richmond Campbell., PA-C Primary Cardiologist: Dr. Herbie Baltimore Primary Electrophysiologist:  None    Patient Profile:   Sydney Herring is a 69 y.o. 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 Katrinka Blazing.  History of Present Illness:   [name] had a visit with Dr. Herbie Baltimore 06/02/19, she had not been seen for 2 years prior to this.   She was feeling well without complaints, was a catch up or re-establichment visit.  He noted PVCs that were asymptomatic, with plans to add BB if she became symptomatic.  There was some mention of some degree of DOE felt 2/2 her COPD.   Planned for annual visit.  She was admitted to [facility] with complaints of new onset palpitations associated with weakness, dizziness.  In the ER noted to have [facility] and cardiology consulted, started on BB therapy.   EP is asked to weigh in rhythm.  LABS K+ 4.1 >> 7.2  Likely a spurious result without intervention next was 4.8  >> 4.1 Mag 1.7 (replacement ordered for this AM) BUN/Creat 24/1.08 >> 19/0.86 HS Trop < 2, 4 BNP 61 WBC 5.0 H/H 12/40 plts 325 TSH 1.760  She tells me she has been retired since her DVT many years ago.  She is fairly active around the house, no difficulties with her ADLs, used to go to the gym prior to COVID.  This year her exercise has markedly dropped off because of COVID.  She has a large hill in her back yard that she avoids makes her SOB to climb, otherwise sound pretty active and without symptoms.  About 2 days ago she was in the back yard looking for her Dog's collar, did much more walking then usual including the hill, became very SOB, felt her heart pounding and went in to  rest and felt better.  The following day helping her husband load some items to bring to the consignment shop and again, SOB, palpitations and felt lightheaded.  This seemed to persist despite resting, recalls her BP being OK, but HR reported by her cuff was 148 and they cam to the ER.  She did not have syncope with these events, and has no h/o syncope  Heart Pathway Score:     Past Medical History:  Diagnosis Date  . Arrhythmia    Palpitations  . Clotting disorder (HCC)    Recurrent DVT  . COPD (chronic obstructive pulmonary disease) (HCC)   . 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 Evaluated with FFR (0.86) - Not physiologically significant.  . CVA (cerebral vascular accident) (HCC) 07/2016  . Diabetes mellitus without complication (HCC)   . DVT (deep venous thrombosis) (HCC)   . Gastroesophageal reflux   . Hyperlipidemia   . Hypertension   . Peripheral vascular disease (HCC)   . 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 Vitamin D deficiency     Past Surgical History:  Procedure Laterality Date  . ELBOW SURGERY    . EYE SURGERY     bilat cataracts removed  . INTRAVASCULAR PRESSURE WIRE/FFR STUDY N/A 12/26/2016   Procedure: INTRAVASCULAR PRESSURE WIRE/FFR STUDY;  Surgeon: Marykay Lex,  MD;  Location: [facility] INVASIVE CV LAB;  Service: Cardiovascular;: FFR obtained lesions in the LAD. Coronary CTA suggested positive FFR. Final invasive FFR was 0.86.  [name]. LEFT HEART CATH AND CORONARY ANGIOGRAPHY N/A 12/26/2016   Procedure: LEFT HEART CATH AND CORONARY ANGIOGRAPHY;  Surgeon: Marykay LexHarding, David W, MD;  [facility] CV LAB;  Service: Cardiovascular: Tandem 50-55% lesions in the proximal and mid LAD. Evaluated with FFR  . TEE WITHOUT CARDIOVERSION N/A 05/08/2016   Procedure: TRANSESOPHAGEAL ECHOCARDIOGRAM (TEE);  Surgeon: Orpah CobbAjay Kadakia, MD; normal LV size EF 55%. No RWMA. No atrial or appendage thrombus. No PFO or ASD.  [name]. TRANSTHORACIC  ECHOCARDIOGRAM  07/2016   EF 55-60%. Nl DD. Trivial MR. Mild Aortic Sclerosis  . WISDOM TOOTH EXTRACTION       Home Medications:  Prior to Admission medications   Medication Sig Start Date End Date Taking? Authorizing Provider  acetaminophen (TYLENOL) 325 MG tablet Take 650 mg by mouth every 6 (six) hours as needed for mild pain.    [provider]  albuterol (PROVENTIL HFA) 108 (90 Base) MCG/ACT inhaler Inhale 2 puffs into the lungs every 4 (four) hours as needed for wheezing or shortness of breath.    [provider]  amLODipine (NORVASC) 2.5 MG tablet TAKE 1 TABLET BY MOUTH EVERY DAY 11/28/16   [provider]  aspirin 81 MG chewable tablet Chew 1 tablet (81 mg total) by mouth daily. 08/04/16   [name], [place], MD  atorvastatin (LIPITOR) 10 MG tablet Take 10 mg by mouth daily.    [provider]  FIBER PO Take 1 capsule by mouth daily.    [provider]  Fluticasone-Salmeterol (ADVAIR) 250-50 MCG/DOSE AEPB Inhale 1 puff into the lungs 2 (two) times daily as needed (for shortness of breath).     [provider]  lansoprazole (PREVACID) 15 MG capsule Take 15 mg by mouth daily. 04/21/16   [provider]  MEGARED OMEGA-3 KRILL OIL 500 MG CAPS Take 500 mg by mouth daily.    [provider]  metFORMIN (GLUCOPHAGE-XR) 750 MG 24 hr tablet Take 500 mg by mouth QID.     [provider]  rivaroxaban (XARELTO) 20 MG TABS tablet Take 20 mg by mouth every evening.  05/15/16   [provider]    Inpatient Medications: Scheduled Meds: . atorvastatin  10 mg Oral Daily  . fluticasone furoate-vilanterol  1 puff Inhalation Daily  . metFORMIN  500 mg Oral QID  . metoprolol succinate  25 mg Oral Daily  . pantoprazole  20 mg Oral Daily  . rivaroxaban  20 mg Oral Q supper   Continuous Infusions:  PRN Meds: acetaminophen, acetaminophen, albuterol, ondansetron (ZOFRAN) IV  Allergies:   No Known Allergies  Social  History:   Social History   Socioeconomic History  . Marital status: Married    Spouse name: Smitty CordsBruce  . Number of children: 0  . Years of education: 5512  . Highest education level: Not on file  Occupational History    Comment: retired  Tobacco Use  . Smoking status: Former Smoker    Quit date: 08/19/2015    Years since quitting: 3.8  . Smokeless tobacco: Never Used  Substance and Sexual Activity  . Alcohol use: Yes    Alcohol/week: 0.0 standard drinks    Comment: social  . Drug use: No  . Sexual activity: Not on file  Other Topics Concern  . Not on file  Social History Narrative   Lives  with husband   Social Determinants of Health   Financial Resource Strain:   . Difficulty of Paying Living Expenses: Not on file  Food Insecurity:   . Worried About Programme researcher, broadcasting/film/video in the Last Year: Not on file  . Ran Out of Food in the Last Year: Not on file  Transportation Needs:   . Lack of Transportation (Medical): Not on file  . Lack of Transportation (Non-Medical): Not on file  Physical Activity:   . Days of Exercise per Week: Not on file  . Minutes of Exercise per Session: Not on file  Stress:   . Feeling of Stress : Not on file  Social Connections:   . Frequency of Communication with Friends and Family: Not on file  . Frequency of Social Gatherings with Friends and Family: Not on file  . Attends Religious Services: Not on file  . Active Member of Clubs or Organizations: Not on file  . Attends Banker Meetings: Not on file  . Marital Status: Not on file  Intimate Partner Violence:   . Fear of Current or Ex-Partner: Not on file  . Emotionally Abused: Not on file  . Physically Abused: Not on file  . Sexually Abused: Not on file    Family History:   Family History  Problem Relation Age of Onset  . Diabetes Mother   . Stroke Mother   . Heart attack Mother      ROS:  Please see the history of present illness.  All other ROS reviewed and negative.      Physical Exam/Data:   Vitals:   06/09/19 2031 06/09/19 2200 06/10/19 0525 06/10/19 0840  BP: 133/88  111/69 115/75  Pulse: 89  64 77  Resp: 19 17 16    Temp: 98 F (36.7 C)  97.8 F (36.6 C)   TempSrc: Oral  Oral   SpO2: 99%  99%   Weight: 69.9 kg     Height: 5\' 3"  (1.6 m)       Intake/Output Summary (Last 24 hours) at 06/10/2019 0844 Last data filed at 06/09/2019 2200 Gross per 24 hour  Intake 120 ml  Output --  Net 120 ml   Last 3 Weights 06/09/2019 06/02/2019 03/18/2017  Weight (lbs) 154 lb 1.6 oz 155 lb 6.4 oz 164 lb 6.4 oz  Weight (kg) 69.899 kg 70.489 kg 74.571 kg     Body mass index is 27.3 kg/m.  General:  Well nourished, well developed, in no acute distress HEENT: normal Lymph: no adenopathy Neck: no JVD Endocrine:  No thryomegaly Vascular: No carotid bruits  Cardiac: RRR; no murmurs, gallops or rubs Lungs:  CTA b/l, no wheezing, rhonchi or rales  Abd: soft, nontender  Ext: no edema Musculoskeletal:  No deformities Skin: warm and dry  Neuro:  No gross focal abnormalities noted Psych:  Normal affect   EKG:  The EKG was personally reviewed and demonstrates:    #1 WCT, 142bpm, RBBB, LAD, transitions V4 #2 SR 97bpnm , no ST/T changes, normal intervals  06/02/19 SR 79bpm, normal EKG   Telemetry:  Telemetry was personally reviewed and demonstrates:   SR 70's-80's  She has had WCT episodes initially lasting minutes in duration and frequently >> less frequent and much shorter, 7-9 beats  Relevant CV Studies:  Cath 12/2016 -- tandem 50-55% p&m LAD lesions - FFR 0.86 -->is not physiologic significant.    Prox LAD lesion, 55 %stenosed. Mid LAD lesion, 55 %stenosed. - Combined lesion FFR was 0.86. (  Not physiologically significant despite CT FFR suggestion of 0.7)  The left ventricular systolic function is low normal. The left ventricular ejection fraction is 50-55% by visual estimate.  LV end diastolic pressure is normal.   Patient indeed does have LAD  disease in the proximal midportion angiographically does not appear significant. By [facility] in the Cath Lab is not physiologic significant.     08/03/2016: TTE Study Conclusions - Left ventricle: The cavity size was normal. Systolic function was   normal. The estimated ejection fraction was in the range of 55%   to 60%. Wall motion was normal; there were no regional wall   motion abnormalities. Left ventricular diastolic function   parameters were normal. - Aortic valve: Trileaflet; normal thickness, mildly calcified   leaflets. - Mitral valve: Calcified annulus. There was trivial regurgitation. - Tricuspid valve: There was trivial regurgitation. - Pulmonary arteries: Systolic pressure could not be accurately   estimated.   Laboratory Data:  High Sensitivity Troponin:   Recent Labs  Lab 06/09/19 1657 06/09/19 2042  TROPONINIHS <2 4     Chemistry Recent Labs  Lab 06/09/19 1657 06/09/19 1657 06/09/19 1703 06/09/19 2042 06/10/19 0323  NA 139   < > 136 141 140  K 4.1   < > 7.2* 4.8 4.1  CL 101   < > 104 106 106  CO2 24  --   --  24 23  GLUCOSE 183*   < > 178* 125* 146*  BUN 24*   < > 39* 22 19  CREATININE 1.08*   < > 1.00 0.92 0.86  CALCIUM 9.4  --   --  9.5 8.8*  GFRNONAA 53*  --   --  >60 >60  GFRAA >60  --   --  >60 >60  ANIONGAP 14  --   --  11 11   < > = values in this interval not displayed.    Recent Labs  Lab 06/09/19 2042  PROT 7.2  ALBUMIN 4.1  AST 20  ALT 27  ALKPHOS 72  BILITOT 0.6   Hematology Recent Labs  Lab 06/09/19 1657 06/09/19 1703  WBC 5.0  --   RBC 4.39  --   HGB 12.9 15.0  HCT 40.2 44.0  MCV 91.6  --   MCH 29.4  --   MCHC 32.1  --   RDW 14.2  --   PLT 325  --    BNP Recent Labs  Lab 06/09/19 2041  BNP 61.0    DDimer No results for input(s): DDIMER in the last 168 hours.   Radiology/Studies:   DG Chest Port 1 View Result Date: 06/09/2019 CLINICAL DATA:  Shortness of breath. Additional history provided by technologist:  Patient reports sudden onset shortness of breath, recent catheterization procedure. EXAM: PORTABLE CHEST 1 VIEW COMPARISON:  CT angiogram chest 05/09/2016, report from chest radiograph 12/03/2010 (images unavailable) FINDINGS: Heart size within normal limits. No airspace consolidation within the lungs. No pleural effusion or pneumothorax. No acute bony abnormality. Overlying cardiac monitoring leads. IMPRESSION: No evidence of acute cardiopulmonary abnormality. Electronically Signed   By: Kellie Simmering DO   On: 06/09/2019 17:41   {   Assessment and Plan:   1. New onset palpitations     WCT  Telemetry is reviewed by Dr. Caryl Comes suspected initially to have been an SVT yesterday though in further review today thinks is VT. Asking Dr. Rayann Heman to weigh in and see today  Beta blocker (Toprol XL 25 daily) has significantly  reduced her arrhythmia burden  She had cath 2018 with NOD, though has risks with uncontrolled DM (Hgb A1c 8.3) especially, her mom died in her 85's of heart disease (she mentions was a terrible DM and smoked heavily), father died in his 32's it seems post-op with a heart surgery of some kind (sounds like CABG), she is a former smoker (none for a couple years), her lipids look OK/controlled. F/u EKG is without ischemic changes, HS Trop negative No anginal sounding symptoms  Will order to have echo updated.   Dr. Johney Frame will see later today     For questions or updates, please contact [facility] Please consult [email or web address] for contact info under     Signed, Sheilah Pigeon, PA-C  06/10/2019 8:44 AM   I have seen, examined the patient, and reviewed the above assessment and plan.  Changes to above are made where necessary.  On exam, RRR.    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  This is benign VT.  We discussed options of metoprolol and ablation today.   For now, she would like to be treated medically with metoprolol. I will increase toprol to 50mg  daily. Her EF is mildly depressed (global).  This does not appear to be consistent with an ischemic pattern and could be due to a recent viral infection or possibly myocardial "stunning" secondary to the VT. I would advise that we repeat her EF in 2-3 months and then consider additional workup if remains depressed.  Anticipate discharge to home tomorrow if her rhythm remains stable with close follow-up with me in clinic.  Co Sign: , MD 06/10/2019 8:30 PM

## 2019-06-10 NOTE — Progress Notes (Signed)
*  PRELIMINARY RESULTS* Echocardiogram 2D Echocardiogram has been performed.  Jeryl Columbia 06/10/2019, 3:30 PM

## 2019-06-10 NOTE — Progress Notes (Signed)
Pt was transferred to 4e24 from ED. CHG bath given, tele box applied, CCMD notified. VSS. Pt's HR frequently kept increasing in the 140s-150s, but would not sustain. Denies any pain or discomfort, feels palpitations. Pt educated to the unit. Will continue to monitor.   Judithann Sheen, RN

## 2019-06-10 NOTE — Progress Notes (Addendum)
The patient has been seen in conjunction with Reino Bellis, NP. All aspects of care have been considered and discussed. The patient has been personally interviewed, examined, and all clinical data has been reviewed.   Appreciate electrophysiology considerations and conversation with Tommye Standard NP.  She has had prior coronary angiography although not within the past 12 months.  There is no clinical data to support ongoing ischemia or significant progression of underlying disease.  Markers are negative.  Non-WCT EKG shows no ischemia.  If however it is felt that excluding ischemia is necessary, repeat catheterization could be easily arranged.  Let me know if this is necessary.  We will defer to EP and agree with him taking her onto their service.   Progress Note  Patient Name: Sydney Herring Date of Encounter: 06/10/2019  Primary Cardiologist: No primary care provider on file.   Subjective   Feeling well this morning. Ordering lunch  Inpatient Medications    Scheduled Meds: . atorvastatin  10 mg Oral Daily  . fluticasone furoate-vilanterol  1 puff Inhalation Daily  . metFORMIN  500 mg Oral QID  . metoprolol succinate  25 mg Oral Daily  . pantoprazole  20 mg Oral Daily  . rivaroxaban  20 mg Oral Q supper   Continuous Infusions: . magnesium sulfate bolus IVPB     PRN Meds: acetaminophen, acetaminophen, albuterol, ondansetron (ZOFRAN) IV   Vital Signs    Vitals:   06/09/19 2031 06/09/19 2200 06/10/19 0525 06/10/19 0840  BP: 133/88  111/69 115/75  Pulse: 89  64 77  Resp: 19 17 16    Temp: 98 F (36.7 C)  97.8 F (36.6 C)   TempSrc: Oral  Oral   SpO2: 99%  99%   Weight: 69.9 kg     Height: 5\' 3"  (1.6 m)       Intake/Output Summary (Last 24 hours) at 06/10/2019 1000 Last data filed at 06/10/2019 0900 Gross per 24 hour  Intake 360 ml  Output -  Net 360 ml   Last 3 Weights 06/09/2019 06/02/2019 03/18/2017  Weight (lbs) 154 lb 1.6 oz 155 lb 6.4 oz 164 lb 6.4 oz   Weight (kg) 69.899 kg 70.489 kg 74.571 kg      Telemetry    SR with short runs of WCT (suspect VT) less frequent this morning - Personally Reviewed  ECG    Initial WCT, RBBB, follow up SR 97 bpm with no ST/TW changes - Personally Reviewed  Physical Exam  Pleasant older WF, sitting up in bed GEN: No acute distress.   Neck: No JVD Cardiac: RRR, no murmurs, rubs, or gallops.  Respiratory: Clear to auscultation bilaterally. GI: Soft, nontender, non-distended  MS: No edema; No deformity. Neuro:  Nonfocal  Psych: Normal affect   Labs    High Sensitivity Troponin:   Recent Labs  Lab 06/09/19 1657 06/09/19 2042  TROPONINIHS <2 4      Chemistry Recent Labs  Lab 06/09/19 1657 06/09/19 1657 06/09/19 1703 06/09/19 2042 06/10/19 0323  NA 139   < > 136 141 140  K 4.1   < > 7.2* 4.8 4.1  CL 101   < > 104 106 106  CO2 24  --   --  24 23  GLUCOSE 183*   < > 178* 125* 146*  BUN 24*   < > 39* 22 19  CREATININE 1.08*   < > 1.00 0.92 0.86  CALCIUM 9.4  --   --  9.5 8.8*  PROT  --   --   --  7.2  --   ALBUMIN  --   --   --  4.1  --   AST  --   --   --  20  --   ALT  --   --   --  27  --   ALKPHOS  --   --   --  72  --   BILITOT  --   --   --  0.6  --   GFRNONAA 53*  --   --  >60 >60  GFRAA >60  --   --  >60 >60  ANIONGAP 14  --   --  11 11   < > = values in this interval not displayed.     Hematology Recent Labs  Lab 06/09/19 1657 06/09/19 1703  WBC 5.0  --   RBC 4.39  --   HGB 12.9 15.0  HCT 40.2 44.0  MCV 91.6  --   MCH 29.4  --   MCHC 32.1  --   RDW 14.2  --   PLT 325  --     BNP Recent Labs  Lab 06/09/19 2041  BNP 61.0     DDimer No results for input(s): DDIMER in the last 168 hours.   Radiology    DG Chest Port 1 View  Result Date: 06/09/2019 CLINICAL DATA:  Shortness of breath. Additional history provided by technologist: Patient reports sudden onset shortness of breath, recent catheterization procedure. EXAM: PORTABLE CHEST 1 VIEW COMPARISON:   CT angiogram chest 05/09/2016, report from chest radiograph 12/03/2010 (images unavailable) FINDINGS: Heart size within normal limits. No airspace consolidation within the lungs. No pleural effusion or pneumothorax. No acute bony abnormality. Overlying cardiac monitoring leads. IMPRESSION: No evidence of acute cardiopulmonary abnormality. Electronically Signed   By: Jackey Loge DO   On: 06/09/2019 17:41    Cardiac Studies   TTE: pending  Patient Profile     69 y.o. female with nonobstructive CAD, recurrent DVT, chronic Xarelto therapy, coronary artery calcification on CT, COPD, essential hypertension, history of splenic infarct, and type 2 diabetes mellitus.  She came to the emergency room because she had sudden onset of rapid heartbeat that caused her to feel weak and dizzy. Called to admit by EDP.  Assessment & Plan    1. WCT: initial EKG was concerning for possible SVT, rhythm stabilized in the ED prior to Dr. Katrinka Blazing evaluation yesterday without intervention. Episodes have improved with the addition of BB, but continues to have brief runs on telemetry. EP consulted and feels this may be more consistent with VT.  -- updated echo ordered, if abnormal suspect will need ischemic work up -- will follow up EP recommendations  2. Recurrent DVT: on Xarelto for many years   3. DM: will hold metformin in the event ischemic work up is warranted.  -- add SSI  4. Non obstructive CAD: 50% p/mLAD lesion noted on cath in 2018. On statin, no ASA with chronic Xarelto.  For questions or updates, please contact [facility] Please consult [email or web address] for contact info under   Signed, Laverda Page, NP  06/10/2019, 10:00 AM

## 2019-06-11 LAB — GLUCOSE, CAPILLARY
Glucose-Capillary: 170 mg/dL — ABNORMAL HIGH (ref 70–99)
Glucose-Capillary: 152 mg/dL — ABNORMAL HIGH (ref 70–99)

## 2019-06-11 MED ORDER — METOPROLOL SUCCINATE ER 50 MG PO TB24: 50.0000 mg | ORAL_TABLET | Freq: Every day | ORAL | 6 refills | Status: DC

## 2019-06-11 NOTE — Progress Notes (Signed)
All discharge instructions provided to patient. All questions and concerns addressed.  IV out. Monitor off CCMD notified. Patient discharging to home.  Ginette Otto, RN

## 2019-06-11 NOTE — Discharge Summary (Addendum)
DISCHARGE SUMMARY    Patient ID: Sydney Herring,  MRN: [id number], DOB/AGE: 06/20/1950 69 y.o.  Admit date: 06/09/2019 Discharge date: 06/11/2019  Primary Care Physician: Aletha Halim., PA-C  Primary Cardiologist: Dr. Ellyn Hack Electrophysiologist: new, Dr. Rayann Heman  Primary Discharge Diagnosis:  1. VT  Secondary Discharge Diagnosis:  1. CAD     Non-obstructive 2. 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 HTN 6. DM       No Known Allergies   Procedures This Admission:   none   Brief HPI: Sydney Herring is a 69 y.o. female with a hx of mild non-obstructive CAD by cath 2018, COPD, DM, HTN, HLD, CVA, chronic DVT, h/o splenic infarct, on long term a/c w/Xarelto sought medical attention with 2 days of new onset palpitations associated with SOB and weakness.  She was observed to have prolonged periods of WCT in the ER,  and admitted for further evaluation and management.      Hospital Course:  The patients labs were unremarkable, electrolytes wnl, baseline EKG normal with ST/T changes, HS Trop negative x2.  She denied any kind of anginal symptoms, she had not had any syncope.  She was started on Toprol,  Initially this was suspect to be an SVT with rate related RBBB.  EP was asked to the case and affter further review, felt to be VT.   In review of her telemetry she was having prolonged periods of arrhythmia, noting durations as long as 2 minutes and happening frequently.  Toprol was observed to markedly reduce her arrhythmia burden.  She was seen by Dr. 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  This is benign VT.  He discussed options of metoprolol and ablation with the patient, she preferred a more conservative approach first with medicine.  Her Toprol was up-titrated to 50mg  daily.   Her EF by TTE noted to be mildly  depressed (global).  Not felt to be consistent with an ischemic pattern and could be due to a recent viral infection or possibly myocardial "stunning" secondary to the VT. Recommended repeating her EF in 2-3 months and then consider additional workup if remains depressed.  She had no further VT, noting in the last 24 hours only occasional single PVCs.  She had 2 nocturnal events of [place] I heart block, both had P>P and slight PR prolongation and likely vagal.     The patient feels well this morning, no CP, SOB, weakness, dizziness.  She is tolerating Toprol well.    She was examined by Dr. Rayann Heman and considered stable for discharge to home.   EP follow up is in place    Physical Exam: Vitals:   06/11/19 0521 06/11/19 0806 06/11/19 0812 06/11/19 0856  BP: 98/66 112/68  112/68  Pulse: 65 64  68  Resp: 19 14    Temp: 97.6 F (36.4 C) 97.7 F (36.5 C)    TempSrc: Oral Oral    SpO2: 94% 98% 98%   Weight:      Height:        GEN- The patient is well appearing, alert and oriented x 3 today.   HEENT: normocephalic, atraumatic; sclera clear, conjunctiva pink; hearing intact; oropharynx clear Lungs- CTA b/l, normal work of breathing.  No wheezes, rales, rhonchi Heart- RRR, no murmurs, rubs or gallops, PMI not laterally displaced GI- soft, non-tender,  non-distended Extremities- no clubbing, cyanosis, or edema MS- no significant deformity or atrophy Skin- warm and dry, no rash or lesion Psych- euthymic mood, full affect Neuro- no gross defecits  Labs:   Lab Results  Component Value Date   WBC 5.0 06/09/2019   HGB 15.0 06/09/2019   HGB 15.0 06/09/2019   HCT 44.0 06/09/2019   HCT 44.0 06/09/2019   MCV 91.6 06/09/2019   PLT 325 06/09/2019    Recent Labs  Lab 06/09/19 2042 06/09/19 2042 06/10/19 0323  NA 141   < > 140  K 4.8   < > 4.1  CL 106   < > 106  CO2 24   < > 23  BUN 22   < > 19  CREATININE 0.92   < > 0.86  CALCIUM 9.5   < > 8.8*  PROT 7.2  --   --   BILITOT  0.6  --   --   ALKPHOS 72  --   --   ALT 27  --   --   AST 20  --   --   GLUCOSE 125*   < > 146*   < > = values in this interval not displayed.    Discharge Medications:  Allergies as of 06/11/2019   No Known Allergies     Medication List    STOP taking these medications   amLODipine 2.5 MG tablet Commonly known as: NORVASC     TAKE these medications   acetaminophen 325 MG tablet Commonly known as: TYLENOL Take 650 mg by mouth every 6 (six) hours as needed for mild pain.   aspirin 81 MG chewable tablet Chew 1 tablet (81 mg total) by mouth daily.   atorvastatin 10 MG tablet Commonly known as: LIPITOR Take 10 mg by mouth daily.   FIBER PO Take 1 capsule by mouth daily.   Fluticasone-Salmeterol 250-50 MCG/DOSE Aepb Commonly known as: ADVAIR Inhale 1 puff into the lungs 2 (two) times daily as needed (for shortness of breath).   glimepiride 2 MG tablet Commonly known as: AMARYL Take 2 mg by mouth daily with breakfast.   lansoprazole 15 MG capsule Commonly known as: PREVACID Take 15 mg by mouth daily.   MegaRed Omega-3 Krill Oil 500 MG Caps Take 500 mg by mouth daily.   metFORMIN 500 MG tablet Commonly known as: GLUCOPHAGE Take 1,000 mg by mouth 2 (two) times daily with a meal.   metoprolol succinate 50 MG 24 hr tablet Commonly known as: TOPROL-XL Take 1 tablet (50 mg total) by mouth daily. Take with or immediately following a meal. Start taking on: June 12, 2019   Proventil HFA 108 (90 Base) MCG/ACT inhaler Generic drug: albuterol Inhale 2 puffs into the lungs every 4 (four) hours as needed for wheezing or shortness of breath.   rivaroxaban 20 MG Tabs tablet Commonly known as: XARELTO Take 20 mg by mouth every evening.       Disposition:  Home Discharge Instructions    Diet - low sodium heart healthy   Complete by: As directed    Increase activity slowly   Complete by: As directed      Follow-up Information    Hillis Range, MD Follow up.     Specialty: Cardiology Why: 07/02/2019 @ 9:45AM, this is a virtual visit, you will be called by Dr. Jenel Lucks staff ahead of time with visit details/instructions Contact information: [address] [place] [address] [phone]  Duration of Discharge Encounter: Greater than 30 minutes including physician time.  Signed, Francis Dowse, PA-C 06/11/2019 10:55 AM   I have seen, examined the patient, and reviewed the above assessment and plan.  Changes to above are made where necessary.  On exam, RRR.  Doing well with metoprolol.  DC to home.  Follow-up with me in 4 weeks virtually.  Co Sign: Hillis Range, MD 06/11/2019 10:00 PM

## 2019-06-11 NOTE — Progress Notes (Addendum)
Telemetry reviewed, SR, infrequent PVCs. No VT 2 episodes of a single dropped beat, both nocturnal (2326 and 0358) there is P>P prolongation as well as slight PR prolongation suggesting vagal  HR 70's BP stable   Anticipate discharge today   Francis Dowse, PA-C

## 2019-06-28 ENCOUNTER — Telehealth: Payer: Self-pay

## 2019-06-29 NOTE — Telephone Encounter (Signed)
Spoke with pt regarding upcoming appt on 06/01/19. Pt stated she will check her vitals and review medications prior to appt. Pt questions and concerns were address.

## 2019-07-02 ENCOUNTER — Encounter: Payer: Self-pay | Admitting: Internal Medicine

## 2019-07-02 ENCOUNTER — Telehealth (INDEPENDENT_AMBULATORY_CARE_PROVIDER_SITE_OTHER): Payer: Medicare HMO | Admitting: Internal Medicine

## 2019-07-02 VITALS — BP 128/87 | HR 71 | Ht 63.0 in | Wt 150.0 lb

## 2019-07-02 DIAGNOSIS — I472 Ventricular tachycardia, unspecified: Secondary | ICD-10-CM

## 2019-07-02 DIAGNOSIS — I10 Essential (primary) hypertension: Secondary | ICD-10-CM

## 2019-07-02 NOTE — Progress Notes (Signed)
Electrophysiology TeleHealth Note   Due to national recommendations of social distancing due to COVID 19, an audio/video telehealth visit is felt to be most appropriate for this patient at this time.  See MyChart message from today for the patient's consent to telehealth for [facility].    Date:  07/02/2019   ID:  Sydney Herring, DOB 1951-02-15, MRN [id number]  Location: patient's home  Provider location:  [facility]  Evaluation Performed: Follow-up visit  PCP:  Aletha Halim., PA-C   Electrophysiologist:  Dr Rayann Heman  Chief Complaint:  VT follow up  History of Present Illness:    Sydney Herring is a 69 y.o. female who presents via telehealth conferencing today.  Since last being seen in our clinic, the patient reports doing very well.  Today, she denies symptoms of palpitations, chest pain, shortness of breath,  lower extremity edema, dizziness, presyncope, or syncope.  The patient is otherwise without complaint today.  The patient denies symptoms of fevers, chills, cough, or new SOB worrisome for COVID 19.  Past Medical History:  Diagnosis Date  . Arrhythmia    Palpitations  . Clotting disorder ([place])    Recurrent DVT  . COPD (chronic obstructive pulmonary disease) ([place])   . 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 Evaluated with FFR (0.86) - Not physiologically significant.  . CVA (cerebral vascular accident) ([place]) 07/2016  . Diabetes mellitus without complication ([place])   . DVT (deep venous thrombosis) ([place])   . Gastroesophageal reflux   . Hyperlipidemia   . Hypertension   . Peripheral vascular disease ([place])   . 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 Vitamin D deficiency   . Wide-complex tachycardia ([place]) 05/2019    Past Surgical History:  Procedure Laterality Date  . ELBOW SURGERY    . EYE SURGERY     bilat cataracts removed  . INTRAVASCULAR PRESSURE WIRE/FFR STUDY N/A 12/26/2016   Procedure:  INTRAVASCULAR PRESSURE WIRE/FFR STUDY;  Surgeon: Leonie Man, MD;  Location: [place] [facility];  Service: Cardiovascular;: FFR obtained lesions in the LAD. Coronary CTA suggested positive FFR. Final invasive FFR was 0.86.  [name] LEFT HEART CATH AND CORONARY ANGIOGRAPHY N/A 12/26/2016   Procedure: LEFT HEART CATH AND CORONARY ANGIOGRAPHY;  Surgeon: Leonie Man, MD;  Location: [place] [facility];  Service: Cardiovascular: Tandem 50-55% lesions in the proximal and mid LAD. Evaluated with FFR  . TEE WITHOUT CARDIOVERSION N/A 05/08/2016   Procedure: TRANSESOPHAGEAL ECHOCARDIOGRAM (TEE);  Surgeon: Dixie Dials, MD; normal LV size EF 55%. No RWMA. No atrial or appendage thrombus. No PFO or ASD.  [name] TRANSTHORACIC ECHOCARDIOGRAM  07/2016   EF 55-60%. Nl DD. Trivial MR. Mild Aortic Sclerosis  . WISDOM TOOTH EXTRACTION      Current Outpatient Medications  Medication Sig Dispense Refill  . acetaminophen (TYLENOL) 325 MG tablet Take 650 mg by mouth every 6 (six) hours as needed for mild pain.    [name] albuterol (PROVENTIL HFA) 108 (90 Base) MCG/ACT inhaler Inhale 2 puffs into the lungs every 4 (four) hours as needed for wheezing or shortness of breath.    [name] aspirin 81 MG chewable tablet Chew 1 tablet (81 mg total) by mouth daily. 30 tablet 0  . atorvastatin (LIPITOR) 10 MG tablet Take 10 mg by mouth daily.    [name] FIBER PO Take 1 capsule by mouth daily.    . Fluticasone-Salmeterol (ADVAIR) 250-50 MCG/DOSE AEPB Inhale 1 puff into  the lungs 2 (two) times daily as needed (for shortness of breath).     [name] glimepiride (AMARYL) 2 MG tablet Take 2 mg by mouth daily with breakfast.    . lansoprazole (PREVACID) 15 MG capsule Take 15 mg by mouth daily.    [name] MEGARED OMEGA-3 KRILL OIL 500 MG CAPS Take 500 mg by mouth daily.    . metFORMIN (GLUCOPHAGE) 500 MG tablet Take 1,000 mg by mouth 2 (two) times daily with a meal.    . metoprolol succinate (TOPROL-XL) 50 MG 24 hr tablet Take 1 tablet (50 mg total) by mouth daily.  Take with or immediately following a meal. 30 tablet 6  . rivaroxaban (XARELTO) 20 MG TABS tablet Take 20 mg by mouth every evening.      No current facility-administered medications for this visit.    Allergies:   Patient has no known allergies.   Social History:  The patient  reports that she quit smoking about 3 years ago. She has never used smokeless tobacco. She reports current alcohol use. She reports that she does not use drugs.   Family History:  The patient's family history includes Diabetes in her mother; Heart attack in her mother; Stroke in her mother.   ROS:  Please see the history of present illness.   All other systems are personally reviewed and negative.    Exam:    Vital Signs:  BP 128/87   Pulse 71   Ht 5\' 3"  (1.6 m)   Wt 150 lb (68 kg)   BMI 26.57 kg/m   Well sounding and appearing, alert and conversant, regular work of breathing,  good skin color Eyes- anicteric, neuro- grossly intact, skin- no apparent rash or lesions or cyanosis, mouth- oral mucosa is pink  Labs/Other Tests and Data Reviewed:    Recent Labs: 06/09/2019: ALT 27; B Natriuretic Peptide 61.0; Hemoglobin 15.0; Hemoglobin 15.0; Magnesium 1.7; Platelets 325; TSH 1.760 06/10/2019: BUN 19; Creatinine, Ser 0.86; Potassium 4.1; Sodium 140   Wt Readings from Last 3 Encounters:  07/02/19 150 lb (68 kg)  06/09/19 154 lb 1.6 oz (69.9 kg)  06/02/19 155 lb 6.4 oz (70.5 kg)      ASSESSMENT & PLAN:    1.  Idiopathic focal VT Benign Symptoms improved on increased dose of metoprolol Update echo in 3 months   2.  HTN Stable No change required today  3.  DVT Continue Xarelto   Follow-up:  With me in 3 months after echo    Patient Risk:  after full review of this patients clinical status, I feel that they are at moderate risk at this time.  Today, I have spent 15 minutes with the patient with telehealth technology discussing arrhythmia management .    06/04/19, MD  07/02/2019  10:11 AM     [facility] [address] [place] [phone] (office) [phone] (fax)

## 2019-07-02 NOTE — Patient Instructions (Signed)
Medication Instructions:  Your physician recommends that you continue on your current medications as directed. Please refer to the Current Medication list given to you today.  *If you need a refill on your cardiac medications before your next appointment, please call your pharmacy*  Lab Work: None ordered If you have labs (blood work) drawn today and your tests are completely normal, you will receive your results only by: [name] MyChart Message (if you have MyChart) OR . A paper copy in the mail If you have any lab test that is abnormal or we need to change your treatment, we will call you to review the results.  Testing/Procedures: Your physician has requested that you have an echocardiogram in 3 months (prior to your follow up appointment with Dr. Johney Frame) . Echocardiography is a painless test that uses sound waves to create images of your heart. It provides your doctor with information about the size and shape of your heart and how well your heart's chambers and valves are working. This procedure takes approximately one hour. There are no restrictions for this procedure.   Follow-Up: At [facility], you and your health needs are our priority.  As part of our continuing mission to provide you with exceptional heart care, we have created designated Provider Care Teams.  These Care Teams include your primary Cardiologist (physician) and Advanced Practice Providers (APPs -  Physician Assistants and Nurse Practitioners) who all work together to provide you with the care you need, when you need it.  Your next appointment:   3 month(s)  -- after you have completed your echocardiogram.  The office will call you to arrange both appointments.   The format for your next appointment:   Virtual Visit   Provider:   Hillis Range, MD

## 2019-07-02 NOTE — Addendum Note (Signed)
Addended by: Baird Lyons on: 07/02/2019 11:10 AM   Modules accepted: Orders

## 2019-09-09 ENCOUNTER — Telehealth: Payer: Medicare HMO | Admitting: Internal Medicine

## 2019-09-21 ENCOUNTER — Other Ambulatory Visit: Payer: Self-pay

## 2019-09-21 ENCOUNTER — Ambulatory Visit (HOSPITAL_COMMUNITY): Payer: Medicare HMO | Attending: Cardiology

## 2019-09-21 DIAGNOSIS — I472 Ventricular tachycardia, unspecified: Secondary | ICD-10-CM

## 2019-09-27 ENCOUNTER — Telehealth (INDEPENDENT_AMBULATORY_CARE_PROVIDER_SITE_OTHER): Payer: Medicare HMO | Admitting: Internal Medicine

## 2019-09-27 ENCOUNTER — Encounter: Payer: Self-pay | Admitting: Internal Medicine

## 2019-09-27 ENCOUNTER — Other Ambulatory Visit: Payer: Self-pay

## 2019-09-27 VITALS — BP 112/80 | HR 75 | Ht 63.0 in | Wt 158.0 lb

## 2019-09-27 DIAGNOSIS — I10 Essential (primary) hypertension: Secondary | ICD-10-CM

## 2019-09-27 DIAGNOSIS — I472 Ventricular tachycardia, unspecified: Secondary | ICD-10-CM

## 2019-09-27 DIAGNOSIS — I82532 Chronic embolism and thrombosis of left popliteal vein: Secondary | ICD-10-CM | POA: Diagnosis not present

## 2019-09-27 NOTE — Progress Notes (Signed)
Electrophysiology TeleHealth Note   Due to national recommendations of social distancing due to COVID 19, an audio/video telehealth visit is felt to be most appropriate for this patient at this time.  See MyChart message from today for the patient's consent to telehealth for [facility].  Date:  09/27/2019   ID:  Sydney Herring, DOB 07/07/1950, MRN [id number]  Location: patient's home  Provider location:  [place]  Evaluation Performed: Follow-up visit  PCP:  Aletha Halim., PA-C   Electrophysiologist:  Dr Rayann Heman  Chief Complaint:  VT follow up  History of Present Illness:    Sydney Herring is a 69 y.o. female who presents via telehealth conferencing today.  Since last being seen in our clinic, the patient reports doing very well.  Today, she denies symptoms of palpitations, chest pain, shortness of breath,  lower extremity edema, dizziness, presyncope, or syncope.  The patient is otherwise without complaint today.   Past Medical History:  Diagnosis Date  . Arrhythmia    Palpitations  . Clotting disorder ([place])    Recurrent DVT  . COPD (chronic obstructive pulmonary disease) ([place])   . 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 Evaluated with FFR (0.86) - Not physiologically significant.  . CVA (cerebral vascular accident) ([place]) 07/2016  . Diabetes mellitus without complication ([place])   . DVT (deep venous thrombosis) ([place])   . Gastroesophageal reflux   . Hyperlipidemia   . Hypertension   . Peripheral vascular disease ([place])   . 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 Vitamin D deficiency   . Wide-complex tachycardia ([place]) 05/2019    Past Surgical History:  Procedure Laterality Date  . ELBOW SURGERY    . EYE SURGERY     bilat cataracts removed  . INTRAVASCULAR PRESSURE WIRE/FFR STUDY N/A 12/26/2016   Procedure: INTRAVASCULAR PRESSURE WIRE/FFR STUDY;  Surgeon: Leonie Man, MD;  Location: [place] [facility];  Service: Cardiovascular;: FFR obtained lesions in the LAD. Coronary CTA suggested positive FFR. Final invasive FFR was 0.86.  [name] LEFT HEART CATH AND CORONARY ANGIOGRAPHY N/A 12/26/2016   Procedure: LEFT HEART CATH AND CORONARY ANGIOGRAPHY;  Surgeon: Leonie Man, MD;  Location: [place] [facility];  Service: Cardiovascular: Tandem 50-55% lesions in the proximal and mid LAD. Evaluated with FFR  . TEE WITHOUT CARDIOVERSION N/A 05/08/2016   Procedure: TRANSESOPHAGEAL ECHOCARDIOGRAM (TEE);  Surgeon: Dixie Dials, MD; normal LV size EF 55%. No RWMA. No atrial or appendage thrombus. No PFO or ASD.  [name] TRANSTHORACIC ECHOCARDIOGRAM  07/2016   EF 55-60%. Nl DD. Trivial MR. Mild Aortic Sclerosis  . WISDOM TOOTH EXTRACTION      Current Outpatient Medications  Medication Sig Dispense Refill  . acetaminophen (TYLENOL) 325 MG tablet Take 650 mg by mouth every 6 (six) hours as needed for mild pain.    [name] albuterol (PROVENTIL HFA) 108 (90 Base) MCG/ACT inhaler Inhale 2 puffs into the lungs every 4 (four) hours as needed for wheezing or shortness of breath.    [name] atorvastatin (LIPITOR) 10 MG tablet Take 10 mg by mouth daily.    [name] FIBER PO Take 1 capsule by mouth daily.    . Fluticasone-Salmeterol (ADVAIR) 250-50 MCG/DOSE AEPB Inhale 1 puff into the lungs 2 (two) times daily as needed (for shortness of breath).     [name] glimepiride (AMARYL) 2 MG tablet Take 2 mg by mouth daily with breakfast.    . lansoprazole (PREVACID)  15 MG capsule Take 15 mg by mouth daily.    [name] MEGARED OMEGA-3 KRILL OIL 500 MG CAPS Take 500 mg by mouth daily.    . metFORMIN (GLUCOPHAGE) 500 MG tablet Take 1,000 mg by mouth 2 (two) times daily with a meal.    . metoprolol succinate (TOPROL-XL) 50 MG 24 hr tablet Take 1 tablet (50 mg total) by mouth daily. Take with or immediately following a meal. 30 tablet 6  . rivaroxaban (XARELTO) 20 MG TABS tablet Take 20 mg by mouth every evening.      No current facility-administered medications for  this visit.    Allergies:   Patient has no known allergies.   Social History:  The patient  reports that she quit smoking about 4 years ago. She has never used smokeless tobacco. She reports current alcohol use. She reports that she does not use drugs.   ROS:  Please see the history of present illness.   All other systems are personally reviewed and negative.    Exam:    Vital Signs:  BP 112/80   Pulse 75   Ht 5\' 3"  (1.6 m)   Wt 158 lb (71.7 kg)   BMI 27.99 kg/m   Well sounding and appearing, alert and conversant, regular work of breathing,  good skin color Eyes- anicteric, neuro- grossly intact, skin- no apparent rash or lesions or cyanosis, mouth- oral mucosa is pink  Labs/Other Tests and Data Reviewed:    Recent Labs: 06/09/2019: ALT 27; B Natriuretic Peptide 61.0; Hemoglobin 15.0; Hemoglobin 15.0; Magnesium 1.7; Platelets 325; TSH 1.760 06/10/2019: BUN 19; Creatinine, Ser 0.86; Potassium 4.1; Sodium 140   Wt Readings from Last 3 Encounters:  09/27/19 158 lb (71.7 kg)  07/02/19 150 lb (68 kg)  06/09/19 154 lb 1.6 oz (69.9 kg)      ASSESSMENT & PLAN:    1.  Idiopathic focal VT Benign Stable on Metoprolol Recent echo reviewed and stable  2.  HTN Stable No change required today  3.  DVT Continue Xarelto   She is moving to [facility] in the next few months.   Risks, benefits and potential toxicities for medications prescribed and/or refilled reviewed with patient today.   Follow-up:  With EP PA in 6 months unless she has moved to [facility]    Patient Risk:  after full review of this patients clinical status, I feel that they are at moderate risk at this time.  Today, I have spent 15 minutes with the patient with telehealth technology discussing arrhythmia management .    [facility], MD  09/27/2019 10:57 AM     [facility] [address] [place] [phone] (office) [phone] (fax)

## 2019-09-29 ENCOUNTER — Telehealth: Payer: Medicare HMO | Admitting: Internal Medicine

## 2019-11-11 ENCOUNTER — Other Ambulatory Visit: Payer: Self-pay | Admitting: Physician Assistant

## 2019-11-12 MED ORDER — METOPROLOL SUCCINATE ER 50 MG PO TB24: 50.0000 mg | ORAL_TABLET | Freq: Every day | ORAL | 3 refills | Status: AC

## 2020-06-14 IMAGING — DX DG CHEST 1V PORT
1 series · 1 of 1 positions shown · non-contrast
Comparison: CT angiogram chest 05/09/2016, report from chest
radiograph 12/03/2010 (images unavailable)

CLINICAL DATA: Shortness of breath. Additional history provided by
technologist: Patient reports sudden onset shortness of breath,
recent catheterization procedure.

EXAM:
PORTABLE CHEST 1 VIEW

[chest]
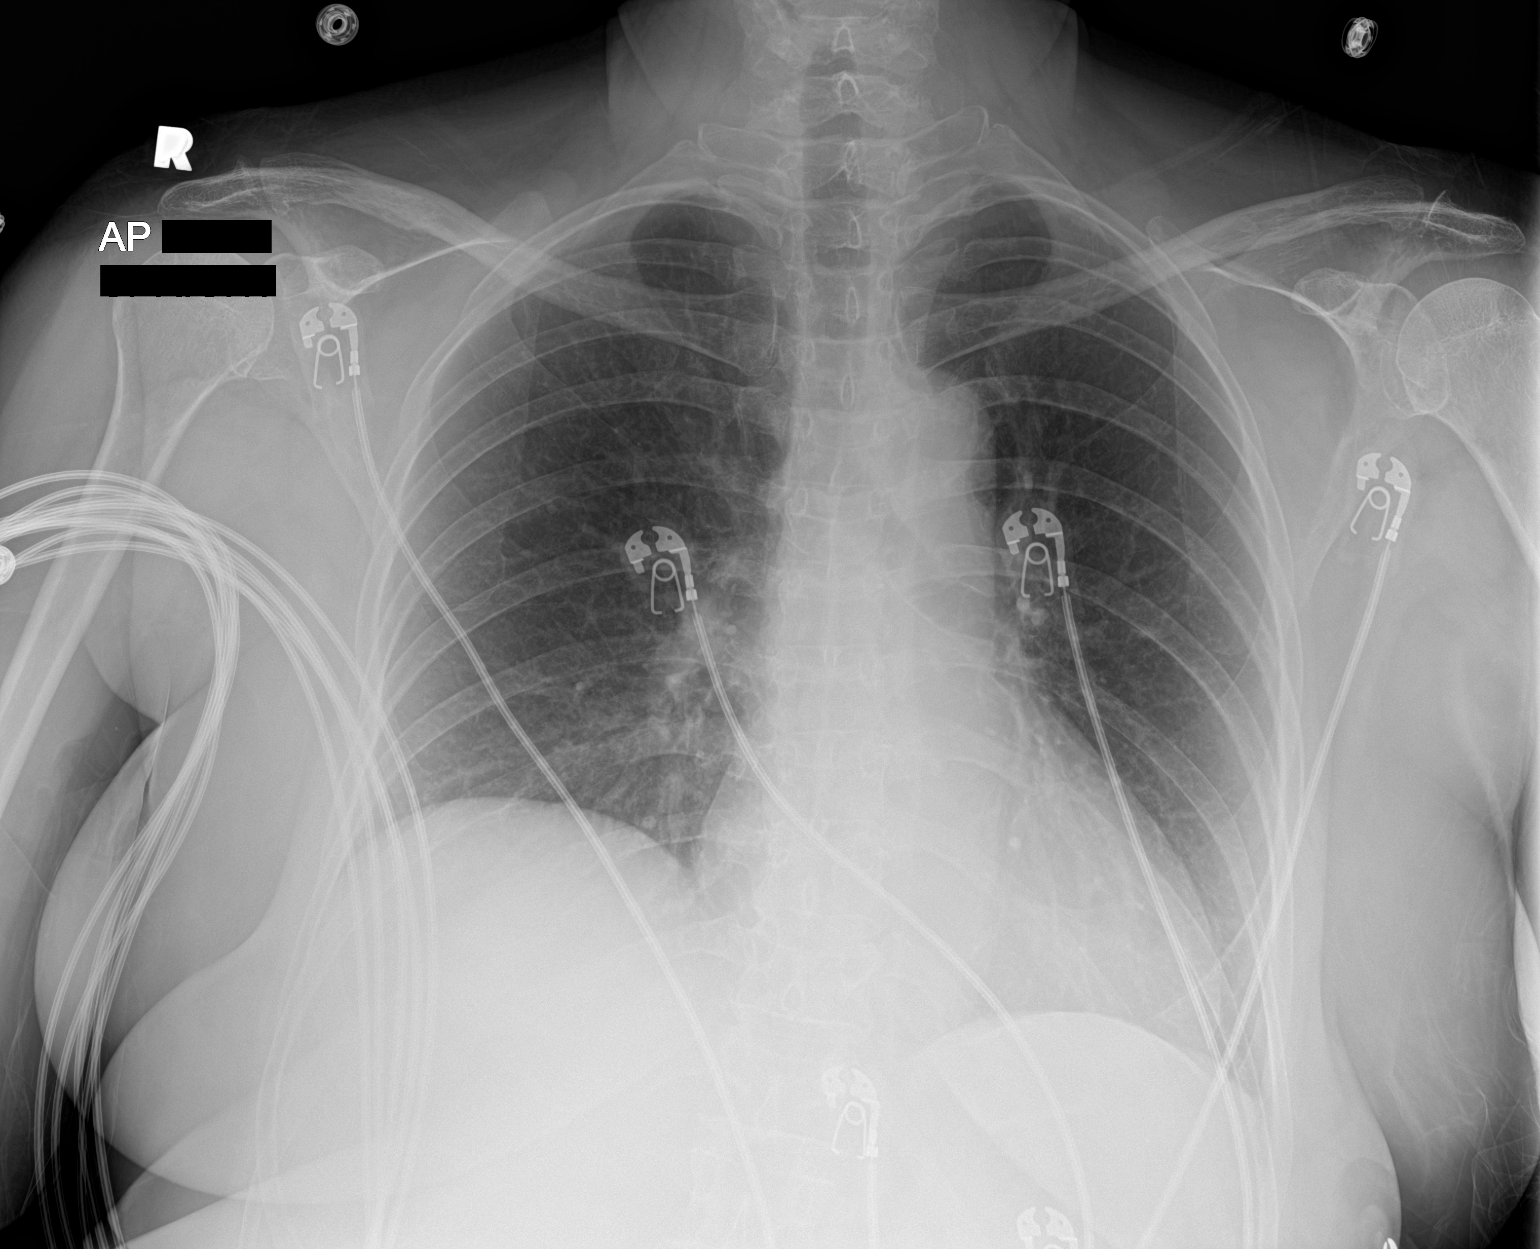

[1 of 1 positions shown; findings below may reference images not displayed]

FINDINGS: Heart size within normal limits. No airspace consolidation within
the lungs. No pleural effusion or pneumothorax. No acute bony
abnormality. Overlying cardiac monitoring leads.
IMPRESSION: No evidence of acute cardiopulmonary abnormality.
